# Patient Record
Sex: Male | Born: 1945 | Race: White | Hispanic: No | Marital: Single
Health system: Southern US, Community
[De-identification: ages and names within clinical notes are randomized; demographics above are authoritative.]

---

## 2005-08-24 ENCOUNTER — Other Ambulatory Visit: Payer: Self-pay

## 2005-08-24 ENCOUNTER — Inpatient Hospital Stay: Payer: Self-pay | Admitting: Internal Medicine

## 2005-08-25 ENCOUNTER — Other Ambulatory Visit: Payer: Self-pay

## 2009-08-28 ENCOUNTER — Emergency Department: Payer: Self-pay | Admitting: Emergency Medicine

## 2010-06-18 ENCOUNTER — Encounter: Payer: Self-pay | Admitting: Orthopedic Surgery

## 2010-06-22 ENCOUNTER — Encounter: Payer: Self-pay | Admitting: Orthopedic Surgery

## 2010-07-22 ENCOUNTER — Encounter: Payer: Self-pay | Admitting: Orthopedic Surgery

## 2010-08-22 ENCOUNTER — Encounter: Payer: Self-pay | Admitting: Orthopedic Surgery

## 2010-09-22 ENCOUNTER — Encounter: Payer: Self-pay | Admitting: Orthopedic Surgery

## 2010-10-22 ENCOUNTER — Encounter: Payer: Self-pay | Admitting: Orthopedic Surgery

## 2011-07-16 ENCOUNTER — Emergency Department: Payer: Self-pay | Admitting: Emergency Medicine

## 2011-07-16 LAB — BASIC METABOLIC PANEL
Anion Gap: 7 (ref 7–16)
BUN: 16 mg/dL (ref 7–18)
Chloride: 101 mmol/L (ref 98–107)
Co2: 29 mmol/L (ref 21–32)
Creatinine: 1.05 mg/dL (ref 0.60–1.30)
EGFR (Non-African Amer.): >60
Glucose: 91 mg/dL (ref 65–99)
Osmolality: 275 (ref 275–301)
Potassium: 4 mmol/L (ref 3.5–5.1)

## 2011-07-16 LAB — CBC
MCH: 32.5 pg (ref 26.0–34.0)
MCV: 100 fL (ref 80–100)
Platelet: 287 10*3/uL (ref 150–440)
RBC: 3.58 10*6/uL — ABNORMAL LOW (ref 4.40–5.90)

## 2013-05-24 ENCOUNTER — Emergency Department: Payer: Self-pay | Admitting: Emergency Medicine

## 2013-05-24 LAB — COMPREHENSIVE METABOLIC PANEL
Albumin: 3.5 g/dL (ref 3.4–5.0)
Alkaline Phosphatase: 257 U/L — ABNORMAL HIGH
Anion Gap: 4 — ABNORMAL LOW (ref 7–16)
BUN: 45 mg/dL — ABNORMAL HIGH (ref 7–18)
Bilirubin,Total: 0.5 mg/dL (ref 0.2–1.0)
CO2: 32 mmol/L (ref 21–32)
Calcium, Total: 9 mg/dL (ref 8.5–10.1)
Chloride: 100 mmol/L (ref 98–107)
Creatinine: 1.54 mg/dL — ABNORMAL HIGH (ref 0.60–1.30)
GFR CALC AF AMER: 53 — AB
GFR CALC NON AF AMER: 46 — AB
GLUCOSE: 89 mg/dL (ref 65–99)
OSMOLALITY: 283 (ref 275–301)
Potassium: 5 mmol/L (ref 3.5–5.1)
SGOT(AST): 59 U/L — ABNORMAL HIGH (ref 15–37)
SGPT (ALT): 63 U/L (ref 12–78)
Sodium: 136 mmol/L (ref 136–145)
Total Protein: 7.2 g/dL (ref 6.4–8.2)

## 2013-05-24 LAB — CK TOTAL AND CKMB (NOT AT ARMC)
CK, Total: 143 U/L
CK-MB: 2.4 ng/mL (ref 0.5–3.6)

## 2013-05-24 LAB — APTT: ACTIVATED PTT: 52.4 s — AB (ref 23.6–35.9)

## 2013-05-24 LAB — URINALYSIS, COMPLETE
BILIRUBIN, UR: NEGATIVE
Bacteria: NONE SEEN
Blood: NEGATIVE
GLUCOSE, UR: NEGATIVE mg/dL (ref 0–75)
Hyaline Cast: 1
KETONE: NEGATIVE
LEUKOCYTE ESTERASE: NEGATIVE
NITRITE: NEGATIVE
PROTEIN: NEGATIVE
Ph: 5 (ref 4.5–8.0)
RBC,UR: 1 /HPF (ref 0–5)
Specific Gravity: 1.006 (ref 1.003–1.030)
WBC UR: <1 /HPF (ref 0–5)

## 2013-05-24 LAB — CBC
HCT: 37.8 % — ABNORMAL LOW (ref 40.0–52.0)
HGB: 12.3 g/dL — ABNORMAL LOW (ref 13.0–18.0)
MCH: 33 pg (ref 26.0–34.0)
MCHC: 32.5 g/dL (ref 32.0–36.0)
MCV: 102 fL — AB (ref 80–100)
PLATELETS: 189 10*3/uL (ref 150–440)
RBC: 3.71 10*6/uL — AB (ref 4.40–5.90)
RDW: 15.4 % — ABNORMAL HIGH (ref 11.5–14.5)
WBC: 5.9 10*3/uL (ref 3.8–10.6)

## 2013-05-24 LAB — TROPONIN I: Troponin-I: 0.04 ng/mL

## 2013-05-24 LAB — PROTIME-INR
INR: 3.2
Prothrombin Time: 31.5 secs — ABNORMAL HIGH (ref 11.5–14.7)

## 2013-05-24 LAB — PRO B NATRIURETIC PEPTIDE: B-Type Natriuretic Peptide: 688 pg/mL — ABNORMAL HIGH (ref 0–125)

## 2013-10-05 ENCOUNTER — Emergency Department: Payer: Self-pay | Admitting: Emergency Medicine

## 2013-10-05 LAB — BASIC METABOLIC PANEL
Anion Gap: 8 (ref 7–16)
BUN: 27 mg/dL — ABNORMAL HIGH (ref 7–18)
CALCIUM: 8.7 mg/dL (ref 8.5–10.1)
CREATININE: 0.9 mg/dL (ref 0.60–1.30)
Chloride: 94 mmol/L — ABNORMAL LOW (ref 98–107)
Co2: 28 mmol/L (ref 21–32)
EGFR (African American): >60
Glucose: 115 mg/dL — ABNORMAL HIGH (ref 65–99)
Osmolality: 267 (ref 275–301)
POTASSIUM: 4.5 mmol/L (ref 3.5–5.1)
SODIUM: 130 mmol/L — AB (ref 136–145)

## 2013-10-05 LAB — TROPONIN I: Troponin-I: 0.02 ng/mL

## 2013-10-05 LAB — CBC WITH DIFFERENTIAL/PLATELET
BASOS ABS: 0 10*3/uL (ref 0.0–0.1)
BASOS PCT: 0.6 %
Eosinophil #: 0.1 10*3/uL (ref 0.0–0.7)
Eosinophil %: 1.7 %
HCT: 36.7 % — ABNORMAL LOW (ref 40.0–52.0)
HGB: 11.8 g/dL — ABNORMAL LOW (ref 13.0–18.0)
LYMPHS PCT: 13.9 %
Lymphocyte #: 0.8 10*3/uL — ABNORMAL LOW (ref 1.0–3.6)
MCH: 32 pg (ref 26.0–34.0)
MCHC: 32.1 g/dL (ref 32.0–36.0)
MCV: 100 fL (ref 80–100)
Monocyte #: 0.8 x10 3/mm (ref 0.2–1.0)
Monocyte %: 13.6 %
NEUTROS PCT: 70.2 %
Neutrophil #: 3.9 10*3/uL (ref 1.4–6.5)
Platelet: 226 10*3/uL (ref 150–440)
RBC: 3.68 10*6/uL — ABNORMAL LOW (ref 4.40–5.90)
RDW: 16.2 % — ABNORMAL HIGH (ref 11.5–14.5)
WBC: 5.5 10*3/uL (ref 3.8–10.6)

## 2013-10-05 LAB — PROTIME-INR
INR: 2.9
PROTHROMBIN TIME: 29.5 s — AB (ref 11.5–14.7)

## 2013-10-18 ENCOUNTER — Inpatient Hospital Stay: Payer: Self-pay | Admitting: Internal Medicine

## 2013-10-18 LAB — CBC WITH DIFFERENTIAL/PLATELET
BASOS ABS: 0 10*3/uL (ref 0.0–0.1)
Basophil %: 0.4 %
EOS ABS: 0.1 10*3/uL (ref 0.0–0.7)
Eosinophil %: 0.9 %
HCT: 36.1 % — AB (ref 40.0–52.0)
HGB: 11.4 g/dL — AB (ref 13.0–18.0)
LYMPHS ABS: 0.8 10*3/uL — AB (ref 1.0–3.6)
Lymphocyte %: 8.8 %
MCH: 31.8 pg (ref 26.0–34.0)
MCHC: 31.7 g/dL — ABNORMAL LOW (ref 32.0–36.0)
MCV: 100 fL (ref 80–100)
MONOS PCT: 12.9 %
Monocyte #: 1.1 x10 3/mm — ABNORMAL HIGH (ref 0.2–1.0)
NEUTROS PCT: 77 %
Neutrophil #: 6.8 10*3/uL — ABNORMAL HIGH (ref 1.4–6.5)
PLATELETS: 168 10*3/uL (ref 150–440)
RBC: 3.6 10*6/uL — AB (ref 4.40–5.90)
RDW: 16.5 % — ABNORMAL HIGH (ref 11.5–14.5)
WBC: 8.8 10*3/uL (ref 3.8–10.6)

## 2013-10-18 LAB — BASIC METABOLIC PANEL
ANION GAP: 4 — AB (ref 7–16)
BUN: 16 mg/dL (ref 7–18)
CHLORIDE: 98 mmol/L (ref 98–107)
CO2: 30 mmol/L (ref 21–32)
CREATININE: 0.94 mg/dL (ref 0.60–1.30)
Calcium, Total: 8.6 mg/dL (ref 8.5–10.1)
EGFR (African American): >60
EGFR (Non-African Amer.): >60
GLUCOSE: 97 mg/dL (ref 65–99)
Osmolality: 266 (ref 275–301)
POTASSIUM: 4 mmol/L (ref 3.5–5.1)
SODIUM: 132 mmol/L — AB (ref 136–145)

## 2013-10-18 LAB — DRUG SCREEN, URINE
Amphetamines, Ur Screen: NEGATIVE (ref ?–1000)
Barbiturates, Ur Screen: NEGATIVE (ref ?–200)
Benzodiazepine, Ur Scrn: NEGATIVE (ref ?–200)
CANNABINOID 50 NG, UR ~~LOC~~: POSITIVE (ref ?–50)
COCAINE METABOLITE, UR ~~LOC~~: POSITIVE (ref ?–300)
MDMA (Ecstasy)Ur Screen: NEGATIVE (ref ?–500)
Methadone, Ur Screen: NEGATIVE (ref ?–300)
OPIATE, UR SCREEN: NEGATIVE (ref ?–300)
Phencyclidine (PCP) Ur S: NEGATIVE (ref ?–25)
TRICYCLIC, UR SCREEN: NEGATIVE (ref ?–1000)

## 2013-10-18 LAB — PROTIME-INR
INR: 1.4
PROTHROMBIN TIME: 17.1 s — AB (ref 11.5–14.7)

## 2013-10-18 LAB — ETHANOL: Ethanol: <3 mg/dL

## 2013-10-19 LAB — CBC WITH DIFFERENTIAL/PLATELET
BASOS PCT: 0.6 %
Basophil #: 0 10*3/uL (ref 0.0–0.1)
Eosinophil #: 0.2 10*3/uL (ref 0.0–0.7)
Eosinophil %: 2.3 %
HCT: 36.4 % — ABNORMAL LOW (ref 40.0–52.0)
HGB: 12 g/dL — ABNORMAL LOW (ref 13.0–18.0)
Lymphocyte #: 0.6 10*3/uL — ABNORMAL LOW (ref 1.0–3.6)
Lymphocyte %: 7.3 %
MCH: 33.3 pg (ref 26.0–34.0)
MCHC: 33.1 g/dL (ref 32.0–36.0)
MCV: 101 fL — AB (ref 80–100)
MONOS PCT: 10.8 %
Monocyte #: 0.9 x10 3/mm (ref 0.2–1.0)
Neutrophil #: 6.3 10*3/uL (ref 1.4–6.5)
Neutrophil %: 79 %
Platelet: 173 10*3/uL (ref 150–440)
RBC: 3.61 10*6/uL — ABNORMAL LOW (ref 4.40–5.90)
RDW: 16.5 % — AB (ref 11.5–14.5)
WBC: 7.9 10*3/uL (ref 3.8–10.6)

## 2013-10-19 LAB — BASIC METABOLIC PANEL
Anion Gap: 6 — ABNORMAL LOW (ref 7–16)
BUN: 11 mg/dL (ref 7–18)
CALCIUM: 8.3 mg/dL — AB (ref 8.5–10.1)
Chloride: 98 mmol/L (ref 98–107)
Co2: 27 mmol/L (ref 21–32)
Creatinine: 0.87 mg/dL (ref 0.60–1.30)
Glucose: 91 mg/dL (ref 65–99)
OSMOLALITY: 262 (ref 275–301)
Potassium: 4 mmol/L (ref 3.5–5.1)
Sodium: 131 mmol/L — ABNORMAL LOW (ref 136–145)

## 2013-10-19 LAB — HEMOGLOBIN A1C: HEMOGLOBIN A1C: 5.9 % (ref 4.2–6.3)

## 2013-10-20 LAB — CBC WITH DIFFERENTIAL/PLATELET
BASOS ABS: 0 10*3/uL (ref 0.0–0.1)
BASOS PCT: 0.5 %
Eosinophil #: 0.2 10*3/uL (ref 0.0–0.7)
Eosinophil %: 2.9 %
HCT: 36.2 % — AB (ref 40.0–52.0)
HGB: 12 g/dL — ABNORMAL LOW (ref 13.0–18.0)
LYMPHS ABS: 0.8 10*3/uL — AB (ref 1.0–3.6)
Lymphocyte %: 10.5 %
MCH: 33.4 pg (ref 26.0–34.0)
MCHC: 33.3 g/dL (ref 32.0–36.0)
MCV: 101 fL — AB (ref 80–100)
Monocyte #: 0.9 x10 3/mm (ref 0.2–1.0)
Monocyte %: 11.5 %
NEUTROS PCT: 74.6 %
Neutrophil #: 5.6 10*3/uL (ref 1.4–6.5)
Platelet: 162 10*3/uL (ref 150–440)
RBC: 3.6 10*6/uL — ABNORMAL LOW (ref 4.40–5.90)
RDW: 16.9 % — ABNORMAL HIGH (ref 11.5–14.5)
WBC: 7.4 10*3/uL (ref 3.8–10.6)

## 2013-10-20 LAB — BASIC METABOLIC PANEL
Anion Gap: 3 — ABNORMAL LOW (ref 7–16)
BUN: 9 mg/dL (ref 7–18)
CHLORIDE: 99 mmol/L (ref 98–107)
CO2: 29 mmol/L (ref 21–32)
Calcium, Total: 8.5 mg/dL (ref 8.5–10.1)
Creatinine: 0.72 mg/dL (ref 0.60–1.30)
EGFR (Non-African Amer.): >60
Glucose: 83 mg/dL (ref 65–99)
Osmolality: 260 (ref 275–301)
Potassium: 4.2 mmol/L (ref 3.5–5.1)
Sodium: 131 mmol/L — ABNORMAL LOW (ref 136–145)

## 2013-10-20 LAB — VANCOMYCIN, TROUGH: VANCOMYCIN, TROUGH: 15 ug/mL (ref 10–20)

## 2013-10-21 LAB — CBC WITH DIFFERENTIAL/PLATELET
Basophil #: 0 10*3/uL (ref 0.0–0.1)
Basophil %: 0.8 %
Eosinophil #: 0.2 10*3/uL (ref 0.0–0.7)
Eosinophil %: 3.6 %
HCT: 35.5 % — ABNORMAL LOW (ref 40.0–52.0)
HGB: 11.8 g/dL — AB (ref 13.0–18.0)
Lymphocyte #: 0.7 10*3/uL — ABNORMAL LOW (ref 1.0–3.6)
Lymphocyte %: 11.3 %
MCH: 33.4 pg (ref 26.0–34.0)
MCHC: 33.3 g/dL (ref 32.0–36.0)
MCV: 100 fL (ref 80–100)
MONO ABS: 0.7 x10 3/mm (ref 0.2–1.0)
Monocyte %: 12.2 %
NEUTROS ABS: 4.3 10*3/uL (ref 1.4–6.5)
Neutrophil %: 72.1 %
PLATELETS: 187 10*3/uL (ref 150–440)
RBC: 3.53 10*6/uL — ABNORMAL LOW (ref 4.40–5.90)
RDW: 16.9 % — ABNORMAL HIGH (ref 11.5–14.5)
WBC: 6 10*3/uL (ref 3.8–10.6)

## 2013-10-21 LAB — BASIC METABOLIC PANEL
Anion Gap: 5 — ABNORMAL LOW (ref 7–16)
BUN: 8 mg/dL (ref 7–18)
CREATININE: 0.69 mg/dL (ref 0.60–1.30)
Calcium, Total: 8.5 mg/dL (ref 8.5–10.1)
Chloride: 100 mmol/L (ref 98–107)
Co2: 27 mmol/L (ref 21–32)
EGFR (African American): >60
Glucose: 97 mg/dL (ref 65–99)
Osmolality: 263 (ref 275–301)
Potassium: 3.9 mmol/L (ref 3.5–5.1)
Sodium: 132 mmol/L — ABNORMAL LOW (ref 136–145)

## 2013-10-21 LAB — DRUG SCREEN, URINE
AMPHETAMINES, UR SCREEN: NEGATIVE (ref ?–1000)
Barbiturates, Ur Screen: NEGATIVE (ref ?–200)
Benzodiazepine, Ur Scrn: NEGATIVE (ref ?–200)
Cannabinoid 50 Ng, Ur ~~LOC~~: POSITIVE (ref ?–50)
Cocaine Metabolite,Ur ~~LOC~~: POSITIVE (ref ?–300)
MDMA (ECSTASY) UR SCREEN: NEGATIVE (ref ?–500)
Methadone, Ur Screen: NEGATIVE (ref ?–300)
Opiate, Ur Screen: NEGATIVE (ref ?–300)
PHENCYCLIDINE (PCP) UR S: NEGATIVE (ref ?–25)
Tricyclic, Ur Screen: NEGATIVE (ref ?–1000)

## 2013-10-21 LAB — VANCOMYCIN, TROUGH: Vancomycin, Trough: 14 ug/mL (ref 10–20)

## 2013-10-22 LAB — COMPREHENSIVE METABOLIC PANEL
ALK PHOS: 365 U/L — AB
ANION GAP: 7 (ref 7–16)
Albumin: 2.9 g/dL — ABNORMAL LOW (ref 3.4–5.0)
BILIRUBIN TOTAL: 2 mg/dL — AB (ref 0.2–1.0)
BUN: 11 mg/dL (ref 7–18)
CO2: 25 mmol/L (ref 21–32)
Calcium, Total: 9.1 mg/dL (ref 8.5–10.1)
Chloride: 99 mmol/L (ref 98–107)
Creatinine: 1.03 mg/dL (ref 0.60–1.30)
EGFR (African American): >60
EGFR (Non-African Amer.): >60
GLUCOSE: 113 mg/dL — AB (ref 65–99)
Osmolality: 263 (ref 275–301)
Potassium: 4.3 mmol/L (ref 3.5–5.1)
SGOT(AST): 49 U/L — ABNORMAL HIGH (ref 15–37)
SGPT (ALT): 58 U/L
Sodium: 131 mmol/L — ABNORMAL LOW (ref 136–145)
TOTAL PROTEIN: 7.2 g/dL (ref 6.4–8.2)

## 2013-10-22 LAB — CBC WITH DIFFERENTIAL/PLATELET
Basophil #: 0.1 10*3/uL (ref 0.0–0.1)
Basophil %: 1 %
EOS ABS: 0.1 10*3/uL (ref 0.0–0.7)
EOS PCT: 1.9 %
HCT: 40 % (ref 40.0–52.0)
HGB: 12.7 g/dL — ABNORMAL LOW (ref 13.0–18.0)
LYMPHS ABS: 1.2 10*3/uL (ref 1.0–3.6)
Lymphocyte %: 17.6 %
MCH: 32.4 pg (ref 26.0–34.0)
MCHC: 31.7 g/dL — ABNORMAL LOW (ref 32.0–36.0)
MCV: 102 fL — ABNORMAL HIGH (ref 80–100)
MONO ABS: 1.2 x10 3/mm — AB (ref 0.2–1.0)
MONOS PCT: 17.1 %
NEUTROS ABS: 4.2 10*3/uL (ref 1.4–6.5)
NEUTROS PCT: 62.4 %
Platelet: 219 10*3/uL (ref 150–440)
RBC: 3.91 10*6/uL — AB (ref 4.40–5.90)
RDW: 17.3 % — ABNORMAL HIGH (ref 11.5–14.5)
WBC: 6.8 10*3/uL (ref 3.8–10.6)

## 2013-10-22 LAB — AMMONIA: AMMONIA, PLASMA: 57 umol/L — AB (ref 11–32)

## 2013-10-22 LAB — WOUND CULTURE

## 2013-10-23 DIAGNOSIS — I341 Nonrheumatic mitral (valve) prolapse: Secondary | ICD-10-CM

## 2013-10-23 LAB — BASIC METABOLIC PANEL
ANION GAP: 8 (ref 7–16)
BUN: 14 mg/dL (ref 7–18)
Calcium, Total: 9 mg/dL (ref 8.5–10.1)
Chloride: 99 mmol/L (ref 98–107)
Co2: 24 mmol/L (ref 21–32)
Creatinine: 1.3 mg/dL (ref 0.60–1.30)
EGFR (Non-African Amer.): 59 — ABNORMAL LOW
Glucose: 97 mg/dL (ref 65–99)
Osmolality: 263 (ref 275–301)
POTASSIUM: 4.1 mmol/L (ref 3.5–5.1)
Sodium: 131 mmol/L — ABNORMAL LOW (ref 136–145)

## 2013-10-23 LAB — CBC WITH DIFFERENTIAL/PLATELET
BASOS PCT: 0.7 %
Basophil #: 0 10*3/uL (ref 0.0–0.1)
EOS ABS: 0.1 10*3/uL (ref 0.0–0.7)
EOS PCT: 2.2 %
HCT: 39.4 % — ABNORMAL LOW (ref 40.0–52.0)
HGB: 12.9 g/dL — AB (ref 13.0–18.0)
LYMPHS ABS: 0.8 10*3/uL — AB (ref 1.0–3.6)
Lymphocyte %: 11.7 %
MCH: 33.3 pg (ref 26.0–34.0)
MCHC: 32.9 g/dL (ref 32.0–36.0)
MCV: 101 fL — AB (ref 80–100)
MONOS PCT: 16.6 %
Monocyte #: 1.1 x10 3/mm — ABNORMAL HIGH (ref 0.2–1.0)
Neutrophil #: 4.6 10*3/uL (ref 1.4–6.5)
Neutrophil %: 68.8 %
Platelet: 244 10*3/uL (ref 150–440)
RBC: 3.89 10*6/uL — ABNORMAL LOW (ref 4.40–5.90)
RDW: 17.2 % — AB (ref 11.5–14.5)
WBC: 6.7 10*3/uL (ref 3.8–10.6)

## 2013-10-23 LAB — DIGOXIN LEVEL: Digoxin: 0.31 ng/mL

## 2013-10-23 LAB — VANCOMYCIN, TROUGH: VANCOMYCIN, TROUGH: 35 ug/mL — AB (ref 10–20)

## 2013-10-24 LAB — HEPATIC FUNCTION PANEL A (ARMC)
ALBUMIN: 2.7 g/dL — AB (ref 3.4–5.0)
AST: 53 U/L — AB (ref 15–37)
Alkaline Phosphatase: 297 U/L — ABNORMAL HIGH
BILIRUBIN TOTAL: 1.6 mg/dL — AB (ref 0.2–1.0)
Bilirubin, Direct: 0.9 mg/dL — ABNORMAL HIGH (ref 0.00–0.20)
SGPT (ALT): 61 U/L
Total Protein: 6.7 g/dL (ref 6.4–8.2)

## 2013-10-24 LAB — BASIC METABOLIC PANEL
Anion Gap: 4 — ABNORMAL LOW (ref 7–16)
BUN: 15 mg/dL (ref 7–18)
CREATININE: 1.41 mg/dL — AB (ref 0.60–1.30)
Calcium, Total: 8.8 mg/dL (ref 8.5–10.1)
Chloride: 98 mmol/L (ref 98–107)
Co2: 28 mmol/L (ref 21–32)
EGFR (African American): >60
EGFR (Non-African Amer.): 53 — ABNORMAL LOW
Glucose: 135 mg/dL — ABNORMAL HIGH (ref 65–99)
Osmolality: 264 (ref 275–301)
Potassium: 3.9 mmol/L (ref 3.5–5.1)
SODIUM: 130 mmol/L — AB (ref 136–145)

## 2013-10-24 LAB — CREATININE, SERUM
Creatinine: 1.44 mg/dL — ABNORMAL HIGH (ref 0.60–1.30)
EGFR (Non-African Amer.): 52 — ABNORMAL LOW

## 2013-10-24 LAB — TSH: Thyroid Stimulating Horm: 2.07 u[IU]/mL

## 2013-10-24 LAB — FOLATE: Folic Acid: 17.7 ng/mL (ref 3.1–100.0)

## 2013-10-24 LAB — HEMOGLOBIN: HGB: 12.7 g/dL — ABNORMAL LOW (ref 13.0–18.0)

## 2013-10-24 LAB — VANCOMYCIN, RANDOM: Vancomycin, Random: 20 ug/mL

## 2013-10-24 LAB — SODIUM: Sodium: 131 mmol/L — ABNORMAL LOW (ref 136–145)

## 2013-10-24 LAB — POTASSIUM: Potassium: 4.8 mmol/L (ref 3.5–5.1)

## 2013-10-24 LAB — DIGOXIN LEVEL: DIGOXIN: 1.1 ng/mL

## 2013-10-25 LAB — AMMONIA: AMMONIA, PLASMA: 56 umol/L — AB (ref 11–32)

## 2013-10-25 LAB — PROTIME-INR
INR: 1.2
PROTHROMBIN TIME: 14.9 s — AB (ref 11.5–14.7)

## 2013-10-25 LAB — SODIUM: SODIUM: 134 mmol/L — AB (ref 136–145)

## 2013-10-26 LAB — BASIC METABOLIC PANEL
Anion Gap: 5 — ABNORMAL LOW (ref 7–16)
BUN: 12 mg/dL (ref 7–18)
CHLORIDE: 102 mmol/L (ref 98–107)
CO2: 27 mmol/L (ref 21–32)
CREATININE: 1.29 mg/dL (ref 0.60–1.30)
Calcium, Total: 9 mg/dL (ref 8.5–10.1)
EGFR (African American): >60
GFR CALC NON AF AMER: 59 — AB
Glucose: 110 mg/dL — ABNORMAL HIGH (ref 65–99)
OSMOLALITY: 269 (ref 275–301)
POTASSIUM: 4 mmol/L (ref 3.5–5.1)
Sodium: 134 mmol/L — ABNORMAL LOW (ref 136–145)

## 2013-10-26 LAB — PROTIME-INR
INR: 1.2
Prothrombin Time: 14.7 secs (ref 11.5–14.7)

## 2013-10-26 LAB — PATHOLOGY REPORT

## 2013-10-27 LAB — BASIC METABOLIC PANEL
ANION GAP: 4 — AB (ref 7–16)
BUN: 9 mg/dL (ref 7–18)
CREATININE: 1.23 mg/dL (ref 0.60–1.30)
Calcium, Total: 8.8 mg/dL (ref 8.5–10.1)
Chloride: 102 mmol/L (ref 98–107)
Co2: 27 mmol/L (ref 21–32)
EGFR (African American): >60
Glucose: 141 mg/dL — ABNORMAL HIGH (ref 65–99)
Osmolality: 267 (ref 275–301)
POTASSIUM: 3.9 mmol/L (ref 3.5–5.1)
SODIUM: 133 mmol/L — AB (ref 136–145)

## 2013-10-27 LAB — CBC WITH DIFFERENTIAL/PLATELET
BASOS PCT: 0.4 %
Basophil #: 0 10*3/uL (ref 0.0–0.1)
Eosinophil #: 0.2 10*3/uL (ref 0.0–0.7)
Eosinophil %: 3.7 %
HCT: 35.5 % — AB (ref 40.0–52.0)
HGB: 11.2 g/dL — ABNORMAL LOW (ref 13.0–18.0)
LYMPHS ABS: 0.6 10*3/uL — AB (ref 1.0–3.6)
LYMPHS PCT: 10.3 %
MCH: 32.1 pg (ref 26.0–34.0)
MCHC: 31.7 g/dL — AB (ref 32.0–36.0)
MCV: 101 fL — AB (ref 80–100)
MONOS PCT: 14.4 %
Monocyte #: 0.8 x10 3/mm (ref 0.2–1.0)
NEUTROS PCT: 71.2 %
Neutrophil #: 4.1 10*3/uL (ref 1.4–6.5)
PLATELETS: 208 10*3/uL (ref 150–440)
RBC: 3.51 10*6/uL — ABNORMAL LOW (ref 4.40–5.90)
RDW: 17.5 % — AB (ref 11.5–14.5)
WBC: 5.7 10*3/uL (ref 3.8–10.6)

## 2013-10-27 LAB — WOUND CULTURE

## 2013-10-27 LAB — PROTIME-INR
INR: 1.2
PROTHROMBIN TIME: 15.1 s — AB (ref 11.5–14.7)

## 2013-10-27 LAB — PLATELET COUNT: Platelet: 206 10*3/uL (ref 150–440)

## 2013-12-18 ENCOUNTER — Inpatient Hospital Stay: Payer: Self-pay | Admitting: Internal Medicine

## 2013-12-18 ENCOUNTER — Emergency Department: Payer: Self-pay | Admitting: Emergency Medicine

## 2013-12-18 LAB — COMPREHENSIVE METABOLIC PANEL
ALT: 46 U/L
ANION GAP: 6 — AB (ref 7–16)
AST: 59 U/L — AB (ref 15–37)
Albumin: 2.4 g/dL — ABNORMAL LOW (ref 3.4–5.0)
Alkaline Phosphatase: 243 U/L — ABNORMAL HIGH
BILIRUBIN TOTAL: 1.4 mg/dL — AB (ref 0.2–1.0)
BUN: 14 mg/dL (ref 7–18)
CO2: 34 mmol/L — AB (ref 21–32)
Calcium, Total: 8.4 mg/dL — ABNORMAL LOW (ref 8.5–10.1)
Chloride: 97 mmol/L — ABNORMAL LOW (ref 98–107)
Creatinine: 0.96 mg/dL (ref 0.60–1.30)
EGFR (Non-African Amer.): >60
GLUCOSE: 86 mg/dL (ref 65–99)
OSMOLALITY: 274 (ref 275–301)
Potassium: 3.5 mmol/L (ref 3.5–5.1)
SODIUM: 137 mmol/L (ref 136–145)
TOTAL PROTEIN: 5.9 g/dL — AB (ref 6.4–8.2)

## 2013-12-18 LAB — URINALYSIS, COMPLETE
BLOOD: NEGATIVE
Bacteria: NONE SEEN
Bilirubin,UR: NEGATIVE
Glucose,UR: NEGATIVE mg/dL (ref 0–75)
Hyaline Cast: 3
KETONE: NEGATIVE
Leukocyte Esterase: NEGATIVE
Nitrite: NEGATIVE
Ph: 6 (ref 4.5–8.0)
Protein: NEGATIVE
RBC,UR: 1 /HPF (ref 0–5)
SPECIFIC GRAVITY: 1.009 (ref 1.003–1.030)
Squamous Epithelial: NONE SEEN
WBC UR: <1 /HPF (ref 0–5)

## 2013-12-18 LAB — CBC
HCT: 32.3 % — ABNORMAL LOW (ref 40.0–52.0)
HGB: 10.4 g/dL — ABNORMAL LOW (ref 13.0–18.0)
MCH: 31 pg (ref 26.0–34.0)
MCHC: 32.2 g/dL (ref 32.0–36.0)
MCV: 96 fL (ref 80–100)
Platelet: 162 10*3/uL (ref 150–440)
RBC: 3.36 10*6/uL — ABNORMAL LOW (ref 4.40–5.90)
RDW: 17.1 % — ABNORMAL HIGH (ref 11.5–14.5)
WBC: 5.1 10*3/uL (ref 3.8–10.6)

## 2013-12-18 LAB — DRUG SCREEN, URINE

## 2013-12-18 LAB — PROTIME-INR
INR: 2
INR: 2
Prothrombin Time: 22.3 secs — ABNORMAL HIGH (ref 11.5–14.7)
Prothrombin Time: 22.3 secs — ABNORMAL HIGH (ref 11.5–14.7)

## 2013-12-18 LAB — CK TOTAL AND CKMB (NOT AT ARMC)
CK, Total: 55 U/L (ref 39–308)
CK-MB: 2.2 ng/mL (ref 0.5–3.6)

## 2013-12-18 LAB — TROPONIN I
Troponin-I: 0.02 ng/mL
Troponin-I: 0.02 ng/mL

## 2013-12-18 LAB — PRO B NATRIURETIC PEPTIDE: B-TYPE NATIURETIC PEPTID: 785 pg/mL — AB (ref 0–125)

## 2013-12-18 LAB — ACETAMINOPHEN LEVEL: ACETAMINOPHEN: 2 ug/mL — AB

## 2013-12-18 LAB — CK-MB: CK-MB: 1.7 ng/mL (ref 0.5–3.6)

## 2013-12-18 LAB — APTT: ACTIVATED PTT: 45.4 s — AB (ref 23.6–35.9)

## 2013-12-18 LAB — ETHANOL: Ethanol: <3 mg/dL

## 2013-12-18 LAB — AMMONIA: Ammonia, Plasma: 16 mcmol/L (ref 11–32)

## 2013-12-18 LAB — SALICYLATE LEVEL: Salicylates, Serum: <1.7 mg/dL

## 2013-12-19 LAB — URINALYSIS, COMPLETE
BILIRUBIN, UR: NEGATIVE
BLOOD: NEGATIVE
Bacteria: NONE SEEN
GLUCOSE, UR: NEGATIVE mg/dL (ref 0–75)
Ketone: NEGATIVE
Leukocyte Esterase: NEGATIVE
Nitrite: NEGATIVE
Ph: 5 (ref 4.5–8.0)
Protein: NEGATIVE
RBC,UR: 1 /HPF (ref 0–5)
SPECIFIC GRAVITY: 1.015 (ref 1.003–1.030)
Squamous Epithelial: <1
WBC UR: 2 /HPF (ref 0–5)

## 2013-12-19 LAB — BASIC METABOLIC PANEL
Anion Gap: 6 — ABNORMAL LOW (ref 7–16)
BUN: 13 mg/dL (ref 7–18)
CALCIUM: 8.9 mg/dL (ref 8.5–10.1)
CO2: 35 mmol/L — AB (ref 21–32)
Chloride: 97 mmol/L — ABNORMAL LOW (ref 98–107)
Creatinine: 1.01 mg/dL (ref 0.60–1.30)
EGFR (African American): >60
EGFR (Non-African Amer.): >60
Glucose: 73 mg/dL (ref 65–99)
OSMOLALITY: 274 (ref 275–301)
POTASSIUM: 3.7 mmol/L (ref 3.5–5.1)
Sodium: 138 mmol/L (ref 136–145)

## 2013-12-19 LAB — CBC WITH DIFFERENTIAL/PLATELET
Basophil #: 0 10*3/uL (ref 0.0–0.1)
Basophil %: 0.7 %
Eosinophil #: 0.1 10*3/uL (ref 0.0–0.7)
Eosinophil %: 1.6 %
HCT: 33.2 % — ABNORMAL LOW (ref 40.0–52.0)
HGB: 10.8 g/dL — ABNORMAL LOW (ref 13.0–18.0)
Lymphocyte #: 0.8 10*3/uL — ABNORMAL LOW (ref 1.0–3.6)
Lymphocyte %: 14.1 %
MCH: 31.3 pg (ref 26.0–34.0)
MCHC: 32.6 g/dL (ref 32.0–36.0)
MCV: 96 fL (ref 80–100)
Monocyte #: 0.8 x10 3/mm (ref 0.2–1.0)
Monocyte %: 13.9 %
NEUTROS ABS: 4.2 10*3/uL (ref 1.4–6.5)
Neutrophil %: 69.7 %
Platelet: 161 10*3/uL (ref 150–440)
RBC: 3.46 10*6/uL — ABNORMAL LOW (ref 4.40–5.90)
RDW: 17.2 % — ABNORMAL HIGH (ref 11.5–14.5)
WBC: 6 10*3/uL (ref 3.8–10.6)

## 2013-12-19 LAB — TROPONIN I: TROPONIN-I: 0.03 ng/mL

## 2013-12-19 LAB — CK-MB: CK-MB: 1.6 ng/mL (ref 0.5–3.6)

## 2013-12-20 LAB — BASIC METABOLIC PANEL
ANION GAP: 6 — AB (ref 7–16)
BUN: 14 mg/dL (ref 7–18)
CHLORIDE: 98 mmol/L (ref 98–107)
Calcium, Total: 8.6 mg/dL (ref 8.5–10.1)
Co2: 35 mmol/L — ABNORMAL HIGH (ref 21–32)
Creatinine: 1.05 mg/dL (ref 0.60–1.30)
EGFR (African American): >60
EGFR (Non-African Amer.): >60
GLUCOSE: 92 mg/dL (ref 65–99)
Osmolality: 278 (ref 275–301)
Potassium: 3.5 mmol/L (ref 3.5–5.1)
Sodium: 139 mmol/L (ref 136–145)

## 2013-12-20 LAB — CBC WITH DIFFERENTIAL/PLATELET
BASOS ABS: 0 10*3/uL (ref 0.0–0.1)
Basophil %: 0.5 %
Eosinophil #: 0.1 10*3/uL (ref 0.0–0.7)
Eosinophil %: 2 %
HCT: 30.3 % — ABNORMAL LOW (ref 40.0–52.0)
HGB: 9.8 g/dL — ABNORMAL LOW (ref 13.0–18.0)
LYMPHS ABS: 1.1 10*3/uL (ref 1.0–3.6)
Lymphocyte %: 17.5 %
MCH: 30.6 pg (ref 26.0–34.0)
MCHC: 32.3 g/dL (ref 32.0–36.0)
MCV: 95 fL (ref 80–100)
MONOS PCT: 17.4 %
Monocyte #: 1.1 x10 3/mm — ABNORMAL HIGH (ref 0.2–1.0)
NEUTROS ABS: 3.8 10*3/uL (ref 1.4–6.5)
NEUTROS PCT: 62.6 %
Platelet: 166 10*3/uL (ref 150–440)
RBC: 3.19 10*6/uL — ABNORMAL LOW (ref 4.40–5.90)
RDW: 17.1 % — ABNORMAL HIGH (ref 11.5–14.5)
WBC: 6 10*3/uL (ref 3.8–10.6)

## 2013-12-20 LAB — PROTIME-INR
INR: 2.1
Prothrombin Time: 22.7 secs — ABNORMAL HIGH (ref 11.5–14.7)

## 2013-12-23 LAB — CULTURE, BLOOD (SINGLE)

## 2014-01-04 ENCOUNTER — Emergency Department: Payer: Self-pay | Admitting: Internal Medicine

## 2014-01-04 LAB — COMPREHENSIVE METABOLIC PANEL
ALBUMIN: 2.5 g/dL — AB (ref 3.4–5.0)
AST: 59 U/L — AB (ref 15–37)
Alkaline Phosphatase: 322 U/L — ABNORMAL HIGH
Anion Gap: 4 — ABNORMAL LOW (ref 7–16)
BILIRUBIN TOTAL: 1.3 mg/dL — AB (ref 0.2–1.0)
BUN: 21 mg/dL — ABNORMAL HIGH (ref 7–18)
CALCIUM: 9.2 mg/dL (ref 8.5–10.1)
CHLORIDE: 94 mmol/L — AB (ref 98–107)
CO2: 37 mmol/L — AB (ref 21–32)
CREATININE: 1.2 mg/dL (ref 0.60–1.30)
GLUCOSE: 103 mg/dL — AB (ref 65–99)
Osmolality: 273 (ref 275–301)
POTASSIUM: 4.3 mmol/L (ref 3.5–5.1)
SGPT (ALT): 54 U/L
Sodium: 135 mmol/L — ABNORMAL LOW (ref 136–145)
TOTAL PROTEIN: 6.8 g/dL (ref 6.4–8.2)

## 2014-01-04 LAB — CBC
HCT: 32.4 % — ABNORMAL LOW (ref 40.0–52.0)
HGB: 10.5 g/dL — ABNORMAL LOW (ref 13.0–18.0)
MCH: 30.3 pg (ref 26.0–34.0)
MCHC: 32.3 g/dL (ref 32.0–36.0)
MCV: 94 fL (ref 80–100)
PLATELETS: 224 10*3/uL (ref 150–440)
RBC: 3.45 10*6/uL — ABNORMAL LOW (ref 4.40–5.90)
RDW: 17.1 % — AB (ref 11.5–14.5)
WBC: 5.5 10*3/uL (ref 3.8–10.6)

## 2014-01-04 LAB — URINALYSIS, COMPLETE
Bacteria: NONE SEEN
Bilirubin,UR: NEGATIVE
Blood: NEGATIVE
Glucose,UR: NEGATIVE mg/dL (ref 0–75)
KETONE: NEGATIVE
LEUKOCYTE ESTERASE: NEGATIVE
Nitrite: NEGATIVE
Ph: 7 (ref 4.5–8.0)
Protein: NEGATIVE
RBC,UR: <1 /HPF (ref 0–5)
SPECIFIC GRAVITY: 1.006 (ref 1.003–1.030)
WBC UR: <1 /HPF (ref 0–5)

## 2014-01-04 LAB — TROPONIN I: TROPONIN-I: 0.02 ng/mL

## 2014-01-04 LAB — PRO B NATRIURETIC PEPTIDE: B-Type Natriuretic Peptide: 998 pg/mL — ABNORMAL HIGH (ref 0–125)

## 2014-01-04 LAB — PROTIME-INR
INR: 1.6
Prothrombin Time: 18.5 secs — ABNORMAL HIGH (ref 11.5–14.7)

## 2014-01-26 ENCOUNTER — Emergency Department: Payer: Self-pay | Admitting: Emergency Medicine

## 2014-01-26 LAB — BASIC METABOLIC PANEL
Anion Gap: 5 — ABNORMAL LOW (ref 7–16)
BUN: 17 mg/dL (ref 7–18)
Calcium, Total: 8.6 mg/dL (ref 8.5–10.1)
Chloride: 95 mmol/L — ABNORMAL LOW (ref 98–107)
Co2: 34 mmol/L — ABNORMAL HIGH (ref 21–32)
Creatinine: 1.14 mg/dL (ref 0.60–1.30)
EGFR (African American): >60
GLUCOSE: 101 mg/dL — AB (ref 65–99)
OSMOLALITY: 270 (ref 275–301)
POTASSIUM: 3.9 mmol/L (ref 3.5–5.1)
Sodium: 134 mmol/L — ABNORMAL LOW (ref 136–145)

## 2014-01-26 LAB — APTT: Activated PTT: 44.5 secs — ABNORMAL HIGH (ref 23.6–35.9)

## 2014-01-26 LAB — CBC WITH DIFFERENTIAL/PLATELET
Basophil #: 0 10*3/uL (ref 0.0–0.1)
Basophil %: 0.9 %
EOS PCT: 2.7 %
Eosinophil #: 0.1 10*3/uL (ref 0.0–0.7)
HCT: 33.8 % — ABNORMAL LOW (ref 40.0–52.0)
HGB: 10.8 g/dL — AB (ref 13.0–18.0)
LYMPHS ABS: 0.8 10*3/uL — AB (ref 1.0–3.6)
Lymphocyte %: 15.1 %
MCH: 29.4 pg (ref 26.0–34.0)
MCHC: 32 g/dL (ref 32.0–36.0)
MCV: 92 fL (ref 80–100)
MONOS PCT: 19.4 %
Monocyte #: 1 x10 3/mm (ref 0.2–1.0)
Neutrophil #: 3.3 10*3/uL (ref 1.4–6.5)
Neutrophil %: 61.9 %
PLATELETS: 218 10*3/uL (ref 150–440)
RBC: 3.67 10*6/uL — AB (ref 4.40–5.90)
RDW: 18 % — AB (ref 11.5–14.5)
WBC: 5.3 10*3/uL (ref 3.8–10.6)

## 2014-01-26 LAB — DIGOXIN LEVEL: Digoxin: 0.71 ng/mL

## 2014-01-26 LAB — TROPONIN I: TROPONIN-I: 0.02 ng/mL

## 2014-01-26 LAB — PROTIME-INR
INR: 1.6
PROTHROMBIN TIME: 18.6 s — AB (ref 11.5–14.7)

## 2014-03-28 ENCOUNTER — Other Ambulatory Visit: Payer: Self-pay | Admitting: Family Medicine

## 2014-05-14 NOTE — Consult Note (Signed)
PATIENT NAME:  John SanteeHOLMES, Treyvone D MR#:  [id number] DATE OF BIRTH:  [birth year]  DATE OF CONSULTATION:  10/18/2013  REFERRING PHYSICIAN:   CONSULTING PHYSICIAN:  Rhona RaiderMatthew G. Esaiah Wanless, DPM  ADDENDUM:    CURRENT MEDICATIONS: Include atorvastatin 10 mg 3 times a week, Coreg 6.25 mg p.o. b.i.d., digoxin 125 mcg 2 tablets daily, enalapril 10 mg twice a day, furosemide 40 mg b.i.d., Neurontin 300 mg t.i.d., Ativan 1 mg b.i.d., metformin 500 mg b.i.d., Spiriva 18 mcg daily, Zoloft 100 mg p.o. daily, Bactrim 1 b.i.d., Symbicort 160/4.5 mcg inhalation 2 times daily, tamsulosin 0.4 mg daily, Coumadin 10 mg 1-1/2 tablets Monday, Wednesday, and Friday, 10 mg on the other days.    ____________________________ Rhona RaiderMatthew G. Aradhya Shellenbarger, DPM mgt:bu D: 10/18/2013 17:37:32 ET T: 10/18/2013 19:38:21 ET JOB#: [id number]  cc: Rhona RaiderMatthew G. Okley Magnussen, DPM, <Dictator> Epimenio SarinMATTHEW G Gennie Dib MD ELECTRONICALLY SIGNED 11/09/2013 10:08

## 2014-05-14 NOTE — Consult Note (Signed)
Brief Consult Note: Diagnosis: alcohol abuse.   Patient was seen by consultant.   Consult note dictated.   Recommend further assessment or treatment.   Orders entered.   Comments: Psychiatry: Patient seen. Chart reviewed. Note dictated. PAtient with alcohol abuse. Will make sure CIWA orders in place if needed. Will need outpt referral after medically well. Will follow.  Electronic Signatures: Tasman Zapata, Jackquline DenmarkJohn T (MD)  (Signed 29-Sep-15 14:14)  Authored: Brief Consult Note   Last Updated: 29-Sep-15 14:14 by Audery Amellapacs, Aura Bibby T (MD)

## 2014-05-14 NOTE — Discharge Summary (Signed)
PATIENT NAME:  John Conrad, John Conrad MR#:  [id number] DATE OF BIRTH:  06-20-1945  DATE OF ADMISSION:  10/18/2013 DATE OF DISCHARGE:    ADMITTING DIAGNOSIS: Diabetic foot infection  with possible osteomyelitis.   DISCHARGE DIAGNOSES: 1.  Acute left fifth metatarsal head osteomyelitis, status post left fifth metatarsal head resection on 10/21/2013 by Dr. Orland Jarred.  2.  Metabolic encephalopathy with acute hypercarbic respiratory failure, requiring intubation.  3.  Hepatic encephalopathy.  4.  Suspected liver cirrhosis, alcoholic liver disease.  5.  Alcohol withdrawal during this admission.  6.  Acute renal failure, likely dehydration, resolved on IV fluids.  7.  Atrial fibrillation, rapid ventricular response, on Coumadin therapy chronically, INR 1.2 on 10/27/2013.  8.  Morbid obesity.  9.  Chronic obstructive pulmonary disease, stable.  10.  Diabetes mellitus with hemoglobin A1c 5.9.  11.  Constipation, resolved with enema on 10/26/2013.   PROCEDURES: PICC line placement on 10/23/2013. The patient's PICC line care should be per protocol.   MEDICATIONS: Sertraline 100 mg p.o. daily, digoxin 125 mcg 2 tablets once daily, gabapentin 300 mg 3 capsules 3 times daily, carvedilol 6.25 mg p.o. twice daily, enalapril 10 mg p.o. twice daily, Symbicort 160/4.5, 2 puffs twice daily, Spiriva 1 inhalation once daily, warfarin 15 mg p.o. on Mondays, Wednesdays, Fridays and 10 mg all other days, atorvastatin 10 mg p.o. 3 times weekly at bedtime, tamsulosin 0.4 mg once daily, albuterol 2 puffs every 6 hours as needed, ketoconazole topical cream 32% to affected area once daily as needed, sildenafil 100 mg p.o. daily as needed. Nasal spray with sodium chloride nasal solution 0.65% solution 1 spray every 4 hours as needed, lorazepam 1 mg twice daily as needed, Tylenol 325 mg 2 tablets every 4 hours as needed, acetaminophen/hydrocodone 325 /5 mg 1 tablet every 6 hours as needed, ranitidine 150 mg p.o. twice daily, lactulose  10 grams in 15 mL oral syrup, 30 mL twice daily, Xifaxan 550  mg p.o. twice daily, Glucerna 237 mL 3 times daily with meals, and Zosyn 4.5 grams IV every 8 hours for 8 more days through 11/04/2013.   HOME OXYGEN: None.   DIET: 2 grams salt, low-fat, low-cholesterol, carbohydrate-controlled diet, mechanical soft.   ACTIVITY LIMITATIONS: As tolerated.   REFERRALS: To physical therapy, speech therapy as well as occupational therapy.    FOLLOWUP APPOINTMENT: With Dr. Orland Jarred in 1 week after discharge, Dr. Sampson Goon in 2 days after discharge.   CONSULTANTS: Care management, social work, Rhona Raider. Troxler, DPM; as well as Stann Mainland. Sampson Goon, MD, Annice Needy, MD, Renford Dills, MD  RADIOLOGIC STUDIES: Chest x-ray, portable single view, 10/27/2013: No active disease. Repeated chest x-ray 10/27/2013, portable single view, after PICC line placement: The right PICC line tip is relatively obscured by body habitus, limiting confident localization. Consider repeated AP view, minimizing patient rotation or, ideally, PA and lateral views.   HISTORY OF PRESENT ILLNESS:  The patient is an 69 year old, Caucasian male with past medical history significant for history of alcohol abuse, also history of chronic atrial fibrillation, on Coumadin therapy at home, who presents to the hospital with complaints of left foot osteomyelitis. Please refer to Dr. Suzanne Boron admission note on 10/18/2013. On arrival to the hospital, patient's vital signs were temperature 98.4, pulse was 112, respiration rate was 20, blood pressure 127/77, saturation was 96% on room air. Physical exam was remarkable for tachycardia, mild pedal edema and poor dorsalis pedis pulses bilaterally. The patient did have altered mental status, and the neurologic  exam was difficult to evaluate. The patient did have left foot ulceration with serosanguineous fluid with surrounding erythema. Ulcer base was revealing fifth metatarsal bone. The patient's lab  data done on admission, 10/18/2013, showed a sodium level of 132. Otherwise, BMP was unremarkable. The patient's urine drug screen was positive for cocaine as well as cannabinoids. The patient's white blood cell count was 8.8, hemoglobin was 11.4, platelet count 168,000. Absolute neutrophil count was 6.8. Coagulation panel revealed pro time of 17.1, INR was 1.4. Wound cultures taken, superficial cultures taken on 10/18/2013 showed Pseudomonas aeruginosa moderate growth sensitive to all antibiotics. He was admitted to the hospital for further evaluation.   HOSPITAL COURSE: He was started on IV broad-spectrum antibiotics with vancomycin as well as Zosyn. He underwent left foot fifth metatarsal head resection on 10/21/2013 by Dr. Orland Jarred, who followed the patient along. Postoperatively, the patient was given sedating medications as well as pain medications. Sedation was given for his alcohol withdrawal. With this, his mental status declined, and he developed acute hypercarbic respiratory failure. He was given Narcan as well as Flomax and some oxygen with some improvement of his condition. He was followed closely in the intensive care unit. He was also noted to be in atrial fibrillation, RVR, requiring Cardizem IV intermittent dosing. Slowly, with conservative therapy he improved. Since the patient's initial wound cultures taken on 10/18/2013 grew pseudomonas but no MRSA, vancomycin was discontinued, and Zosyn was continued. PICC line was placed on 10/23/2013. The patient's would cultures done during surgery, 10/21/2013, did not show any growth. We feel the patient would benefit from antibiotic therapy for 2 weeks from 10/21/2013 through 11/04/2013 with Zosyn. The patient is to continue to follow up with Dr. Sampson Goon for further recommendations in regard to management of his osteomyelitis. Dr. Orland Jarred followed the patient along while he was in the hospital and felt that his would was nicely healing. Dr. Orland Jarred saw  the patient in consultation on 10/25/2013 and felt that the patient's foot was stable with intact incision margin dorsal and clean wound plantarly. He recommended to start shoes for both feet to facilitate physical therapy. The patient had his wound repacked as well as redressed of the  foot on 10/26/2013. It is recommended to follow up with Dr. Orland Jarred for further recommendations in regards to wound management as outpatient.   In regards to acute renal failure, the patient was given IV fluids, and his kidney function normalized by the day of discharge. It was felt to be some dehydration as well as infection -related. As for atrial fibrillation, RVR, the patient was restarted on Coumadin therapy after surgery, and his INR is 1.2 on 10/27/2013. For COPD, the patient's COPD was stable. The patient is to continue his inhalation therapy. For diabetes mellitus, hemoglobin A1c was checked and was found to be 5.9. The patient would benefit from diabetic diet alone. No other medications were recommended. The patient was noted to be constipated, requiring enema administration on 10/26/2013, after which his constipation resolved. For hepatic encephalopathy, the patient is to continue lactulose as well as Xifaxan. His mental status improved significantly since admission.   The patient is being discharged in stable condition with the above-mentioned medications and follow-up. On the day of discharge, temperature was 98.7, pulse was 73, respirations 20, blood pressure 116/72, saturation was 94% on room air at rest.   TIME SPENT: 40 minutes on this patient.    ____________________________ Katharina Caper, MD rv:je D: 10/27/2013 13:26:00 ET T: 10/27/2013 14:25:45 ET JOB#:  [id number]  cc: Stann Mainlandavid P. Sampson GoonFitzgerald, MD Rhona RaiderMatthew G. Troxler, DPM Katharina Caperima Kaydince Towles, MD, <Dictator> Youssef Footman MD ELECTRONICALLY SIGNED 10/28/2013 17:02

## 2014-05-14 NOTE — Consult Note (Signed)
PATIENT NAME:  John Conrad, HELZER MR#:  [id number] DATE OF BIRTH:  Jun 15, 1945  DATE OF CONSULTATION:  10/20/2013  REFERRING PHYSICIAN:  Dr. Allena Katz.   CONSULTING PHYSICIAN:  Stann Mainland. Sampson Goon, MD  John Conrad FOR CONSULTATION: Osteomyelitis of the foot.   CONSULTING PODIATRIST:  Dr. Orland Jarred.  CONSULTING VASCULAR SURGEON:  Dr. Gilda Crease.   CONSULTING PSYCHIATRIST: Dr. Toni Amend.    HISTORY OF PRESENT ILLNESS: This is a 69 year old gentleman with history of diabetes as well as and hypertension, atrial fibrillation, neuropathy polysubstance abuse including alcohol, cocaine and marijuana, admitted from Dr. Racheal Patches office with drainage of the ulcer on the bottom of his foot and suspicion of osteomyelitis. He has been dealing with this infection in foot for several months now. He was seen last week in the podiatry clinic and started on Bactrim. The foot, however, worsened and he was brought to the clinic and admitted directly from there. He had also been having some falls. Since admission, he was found to have positive cocaine and marijuana. He is on CIWA protocol for heavy alcohol abuse as well. He has been seen by Dr. Gilda Crease who feels that vascular circulation is intact. He was unable to get an MRI due to his restless leg syndrome and unable to hold still.   PAST MEDICAL HISTORY: Diabetes, hypertension, diabetic neuropathy, atrial fibrillation, sleep apnea, COPD, cardiomyopathy, possible history of MRSA infection.   PAST SURGICAL HISTORY: Knee surgery.   SOCIAL HISTORY: He smokes and drinks apparently. Positive test for cocaine and marijuana.   ALLERGIES: No known drug allergies.   REVIEW OF SYSTEMS: Unable to be obtained.   PHYSICAL EXAMINATION:  VITAL SIGNS: Temperature 97.7, pulse 86, blood pressure 128/81, respirations 20, sat 96% on room air. He has been afebrile since admission.  GENERAL: He is obese, lying in bed, snoring loudly in his sleep, but he is arousable. He gives minimal history.   EYES: His pupils are reactive. Extraocular movements are intact.  OROPHARYNX: Clear.  NECK: Supple.  HEART: Regular with distant heart sounds.  LUNGS: Clear with no wheeze.  ABDOMEN: Obese, soft, nontender.  EXTREMITIES: On his left foot on the medial aspect he has an ulcer that is approximately 1 cm in diameter and deep and is draining purulence. The foot seems somewhat tender although he is not very cooperative.   DATA: White blood count on admission is 7.9, currently 7.4, hemoglobin 12.0, platelets 162. Renal function shows a creatinine of 0.72, ethanol level was less than 3. Urine toxicology screen was positive for cocaine and marijuana. A1c was 5.9. Culture data of the foot showed pseudomonas heavy growth sensitive to ciprofloxacin and all other tested IV antibiotics. Review of recent outpatient laboratories show no culture data, but he did have negative HIV test. A1c in June was 6.1.  IMPRESSION: A 69 year old gentleman with diabetes, relatively well-controlled diabetic neuropathy and an infected foot ulcer growing Pseudomonas. He has a deep wound that probes to bone. He could not tolerate an MRI and a bone scan is pending. He has been evaluated by podiatry for surgery. Dr. Gilda Crease has seen him and feels his circulation is intact.   He has positive cocaine and marijuana. I am hesitant to use home IV antibiotics in this patient given his substance abuse history. Fortunately, the pseudomonas is sensitive to ciprofloxacin. I do not see any evidence of MRSA or other infections. He is currently on vancomycin and Zosyn.   I would suggest continuing the vancomycin and Zosyn until final culture results. If no MRSA  or other organisms are identified he could be discharged on ciprofloxacin and Flagyl with duration depending on the findings on his potential surgery and bone scan.   Thank you for the consult. I will be glad to follow with you.    ____________________________ Stann Mainlandavid P. Sampson GoonFitzgerald,  MD dpf:JT D: 10/20/2013 21:09:13 ET T: 10/21/2013 01:49:13 ET JOB#: [id number]  cc: Stann Mainlandavid P. Sampson GoonFitzgerald, MD, <Dictator> Tod Abrahamsen Sampson GoonFITZGERALD MD ELECTRONICALLY SIGNED 10/24/2013 23:56

## 2014-05-14 NOTE — H&P (Signed)
PATIENT NAME:  John Conrad, John Conrad MR#:  [id number] DATE OF BIRTH:  [birth year]  DATE OF ADMISSION:  12/18/2013  PRIMARY CARE PHYSICIAN: Nonlocal.   REFERRING PHYSICIAN: Eryka A. Inocencio HomesGayle, MD  CHIEF COMPLAINT: Altered mental status.   HISTORY OF PRESENT ILLNESS: John Conrad is a 69 year old male who was admitted in September 2015 for left fifth metatarsal osteomyelitis, status post resection, and currently receiving IV antibiotic with Zosyn through a PICC line. He has been residing in a nursing home. Also has history of alcohol abuse, cirrhosis, atrial fibrillation on chronic anticoagulation with Coumadin, obesity, who was doing in his usual state of health until last night. The patient's family visited him last night and he was walking with the help of a walker. This morning, the patient hit his leg on the wheelchair and had bleeding from the right leg, for which reason, the patient was sent to the Emergency Department. Per family, the patient was sleeping in the Emergency Department. When the patient was sent back to the nursing home the patient was found to be in altered mental status, for which reason, the patient was sent back to the Emergency Department. Workup in the Emergency Department: No obvious signs of infection are noted. ABG showed pH of 7.41, pCO2 of 60, PaO2 of 61. For which reason, the patient was placed on BiPAP. CT head without contrast was unremarkable. Chest x-ray did not show any obvious infiltrates. No obvious signs of any infection are noted. Urine drug screen is negative. The patient, in the nursing home, is on scheduled Ativan 2 times a day, gabapentin 300 mg 3 times a day, and Percocet. I could not obtain any history from the patient. The history is mainly obtained from the patient's family and medical records from the previous admission.   PAST MEDICAL HISTORY:  1.  Hypertension.  2.  Chronic atrial fibrillation.  3.  COPD.  4.  Cardiomyopathy.  5.  Cirrhosis.  6.  History of  alcohol abuse.  7.  History of polysubstance abuse.  8.  Congestive heart failure.  9.  Osteomyelitis of the left foot and right foot.   PAST SURGICAL HISTORY:  1.  Knee surgery.  2.  Foot debridement.    ALLERGIES: No known drug allergies.   HOME MEDICATIONS:  1.  Coumadin 10 mg 1-1/2 tablets once a day   2.  Coumadin 10 mg on the other days   3.  Tamsulosin 0.4 mg once a day.  4.  Symbicort 2 puffs 2 times a day.  5.  Spiriva 18 mcg once a day.  6.  Sildenafil 100 mg once a day.  7.  Sertraline 100 mg once a day.  8.  Rifaximin 550 mg 2 times a day.  9.  Ranitidine 150 mg every 12 hours.  10.  Zosyn 4.5 mg every 8 hours.  11.  Lorazepam 1 mg 2 times a day.  12.  Lactulose 30 mL 2 times a day.  13.  Neurontin 900 mg 3 times a day.  14.  Enalapril 10 mg 2 times a day.  15.  Digoxin 125 mcg daily.  16.  Coreg 6.25 mg 2 times a day.  17.  Atorvastatin 10 mg 3 times a week.  18.  Albuterol 2 puffs every 6 hours as needed.  19.  Percocet 1 tablet every 6 hours as needed.   SOCIAL HISTORY: History of smoking. Drank alcohol and used marijuana and cocaine prior to the admission in September.   FAMILY  HISTORY: Could not be obtained from the patient.   REVIEW OF SYSTEMS: Could not be obtained secondary to altered mental status.   PHYSICAL EXAMINATION:  GENERAL: This is a well-built, well-nourished, chronically ill-looking male, lying down in the bed, on BiPAP.  VITAL SIGNS: Temperature 97.4, pulse 115, blood pressure 108/68, respiratory rate of 15, oxygen saturation 100% on BiPAP.  HEENT: Head: Normocephalic, atraumatic. There is no scleral icterus. Conjunctivae are normal. Pupils equal and react to light. Mucous membranes are moist. No pharyngeal erythema.  NECK: Supple. No lymphadenopathy. No JVD. No carotid bruit.  CHEST: No focal tenderness. Decreased breath sounds in the lower lobes.  HEART: S1, S2 regular. No murmurs are heard.  ABDOMEN: Bowel sounds are present. Soft,  nontender, nondistended. Could not appreciate any hepatosplenomegaly secondary to obesity.  EXTREMITIES: No pedal edema. He has a laceration of about 3 to 4 cm. The wound has mild oozing of blood consistent. Pulses 2+. NEUROLOGIC: The patient is not oriented to place, person, and time. Mild response to painful stimuli. Opens eyes, quickly falls asleep. Could not examine the motor and sensory.  SKIN: Anasarca.  MUSCULOSKELETAL: Could not examine secondary to the patient's altered mental status.   LABORATORY DATA: ABG, pH of 7.41, pCO2 of 59, PaO2 of 61. CBC: WBC of 5.1, hemoglobin 10.4, platelet count of 162,000. Rest of all the values are within normal limits.   ASSESSMENT AND PLAN: Mr. Smead is a 69 year old male who was brought to the Emergency Department with altered mental status.  1.  Altered mental status: This could be a combination of factors or a single factor. The patient is on multiple sedative medications. He has an underlying history of cirrhosis with previous episodes of hepatic encephalopathy. The patient's urine drug screen is negative. CT head does not show any stroke; however, could not examine motor. Admit the patient to a monitored bed. Continue with the BiPAP. Hold all sedative medications. Continue with intravenous fluids and follow up. If the patient does not improve with the mental status after holding the sedative medications, consider getting an MRI of the brain. The other possibility of hepatic encephalopathy. The patient's ammonia level is not high; however, considering the patient's underlying condition, we will start the patient on lactulose and follow up.  2.  Atrial fibrillation with rapid ventricular rate: Rate is moderately controlled. Heart rate is 115. This could be from not taking his medications. We will give digoxin intravenously and Lopressor and follow up. The patient is on anticoagulation with Coumadin. INR is therapeutic.  3.  History of cirrhosis: We will  continue with the lactulose.  4.  Hypertension, moderately controlled: Continue with enalapril. 5.  History of osteomyelitis: Continue with Zosyn.  6.  History of polysubstance abuse: Urine drug screen is negative now.  7.  The patient is already on therapeutic INR, which should provide deep venous thrombosis prophylaxis as well.   TIME SPENT: 60 minutes.    ____________________________ Susa Griffins, MD pv:ts Conrad: 12/18/2013 22:07:58 ET T: 12/18/2013 22:32:21 ET JOB#: [id number]  cc: Susa Griffins, MD, <Dictator> Susa Griffins MD ELECTRONICALLY SIGNED 12/31/2013 21:28

## 2014-05-14 NOTE — H&P (Signed)
PATIENT NAME:  John SanteeHOLMES, John Conrad MR#:  [id number] DATE OF BIRTH:  [birth year]  DATE OF ADMISSION:  10/18/2013   The patient's laboratories are available. pH is 7.27, CO2 is 64, pO2 112 and ABG is done on room air.  His other labs showed a Chem-7, sodium 132, potassium 4, chloride 98, bicarbonate 30, BUN 16, creatinine 0.9,  glucose 97. The patient's INR is 1.4. Alcohol level less than 3.   I have the medication list from his pharmacy. Atorvastatin 10 mg 3 times a week, Coreg 6.25 mg p.o. b.i.Conrad., digoxin 125 mcg 2 tablets daily, Enalapril 10 mg 2 times a day, furosemide 40 mg 2 tablets daily, Neurontin 300 mg p.o. t.i.Conrad., Ativan 1 mg p.o. b.i.Conrad., metformin 500 mg p.o. b.i.Conrad., Spiriva 18 mcg inhalation daily, Zoloft 100 mg p.o. daily, Bactrim 1 tablet p.o. b.i.Conrad. started on 09/22/2013 for 20 days, Symbicort 160/4.5 mcg inhalation 2 times daily, tamsulosin 0.4 mg daily, Coumadin 10 mg 1-1/2 tablets Monday/Wednesday/Friday and 1 tablet of 10 mg on the other days.   ASSESSMENT AND PLAN:  1.  The patient does have respiratory acidosis and altered mental status secondary to hypercapnia. The patient will be on BiPAP, and we will see how he responds.  2.  Regarding atrial fibrillation, INR is not therapeutic. We will continue the Lovenox 1 mg/kg and restart the Coumadin when he is more awake and alert, and also if CT head is negative for any bleed.  3.  Benign prostatic hypertrophy. Will restart Flomax 0.4 mg daily.    ____________________________ Katha HammingSnehalatha Kandyce Dieguez, MD sk:MT Conrad: 10/18/2013 12:12:07 ET T: 10/18/2013 12:44:15 ET JOB#: [id number]  cc: Katha HammingSnehalatha Adolphus Hanf, MD, <Dictator> Katha HammingSNEHALATHA Tyonna Talerico MD ELECTRONICALLY SIGNED 10/18/2013 15:36

## 2014-05-14 NOTE — Consult Note (Signed)
PATIENT NAME:  John Conrad, John Conrad MR#:  [id number] DATE OF BIRTH:  [birth year]  DATE OF CONSULTATION:  10/24/2013  CONSULTING PHYSICIAN:  John Needy, MD  REASON FOR CONSULTATION:  Evaluate his venous access.   HISTORY OF PRESENT ILLNESS:  This is a 69 year old, morbidly obese gentleman in the Critical Care Unit with infection in his foot and multiple other ongoing issues. He had a PICC line placed via the [employer] earlier this week, and it was unclear whether or not this was a usable device. The patient is sleeping and does not awaken easily and so the history is  obtained mostly from his nurse. He apparently had been admitted to the hospital several days before with left foot osteomyelitis, which has required debridement and surgery, and will require extended IV antibiotics. He has a long history of alcohol use and apparently had vodka in the hospital earlier on this admission. Apparently, his baseline of mental functioning is sleeping and snoring most of the time with episodes of alertness and this is his baseline per family. Again, the history was obtained from the previous medical record and the nursing staff. Apparently, the original PICC line was placed and would not withdraw. It was manipulated and 2 further x-rays have been obtained, but the nursing service does not know if this is a usable device.   PAST MEDICAL HISTORY: Significant for: 1.  Hypertension.  2.  Atrial fibrillation.  3.  COPD.   4.  Tobacco and alcohol abuse.  5.  History of cardiomyopathy.  6.  Osteomyelitis of the foot.   ALLERGIES: None known.    SOCIAL HISTORY: Apparently, he smokes and drinks regularly and had vodka in the hospital. Apparently, there is no reported use of illicit drugs.   PAST SURGICAL HISTORY: Significant for knee surgery and foot debridement.   MEDICATIONS:  Per the history and physical, include atorvastatin 10 mg every other day, Coreg 6.25 mg b.i.d., digoxin 125 mcg twice daily,  enalapril 10 mg twice daily, Lasix 40 mg twice daily, Neurontin 300 mg t.i.d., Ativan 1 mg b.i.d., metformin 500 mg b.i.d., Spiriva inhalation daily, Zoloft 100 mg daily, Bactrim double strength daily, Symbicort inhalation twice daily, tamsulosin 0.4 mg daily, Coumadin alternating 10 and 15 mg.   REVIEW OF SYSTEMS:  Is not obtainable due to the patient's mental status.   PHYSICAL EXAMINATION:   VITAL SIGNS: Show a temperature of 96.9, pulse of 74, blood pressure is 122/67, saturations are 100% on nasal cannula.  HEENT: Normocephalic and atraumatic. Eyes: Sclerae are anicteric. Conjunctivae are clear. Ears: Normal external appearance. Difficult to assess hearing as the patient really does not respond to voice.  SKIN:  Normal turgor and intact without obvious ulcers except for the left foot, which is bandaged.  NEUROLOGIC:  Lethargic and stuporous, not alert or oriented.  HEART:  Irregularly irregular with a murmur present.  LUNGS: Diminished particularly at the bases, but equal bilaterally.  ABDOMEN: Soft, nondistended, nontender.   EXTREMITIES:  PICC line comes out of the right arm. He has lower extremity swelling and the bandage on his foot.   LABORATORY EVALUATIONS:  Reveal a sodium of 130. Potassium is 3.9, chloride is 98, CO2 is 28, BUN is 15, creatinine is 1.41, glucose is 135. A CBC was done yesterday with a white blood count of 6.7, hemoglobin is 12.9, and platelet count is 244,000.   IMAGING: Chest x-ray shows the tip of the PICC line in the right arm to be in the  distal superior vena cava without pneumothorax, and cardiomegaly is present.   ASSESSMENT:  1.  This PICC line is in excellent location for use and can be used regularly for his venous access both now and in the future for his IV antibiotics.  2.  Multiple other medical issues being managed by primary service.   PLAN:  Use PICC line as needed.  We will sign off. Please call with questions. This is a level 3 consultation.      ____________________________ John NeedyJason S. Tahra Hitzeman, MD jsd:lr D: 10/25/2013 15:38:27 ET T: 10/25/2013 16:25:50 ET JOB#: [id number]  cc: John NeedyJason S. Leona Alen, MD, <Dictator> John NeedyJASON S Isamar Nazir MD ELECTRONICALLY SIGNED 11/02/2013 10:23

## 2014-05-14 NOTE — Op Note (Signed)
PATIENT NAME:  John Conrad, John Conrad MR#:  [id number] DATE OF BIRTH:  03/05/1945  DATE OF PROCEDURE:  10/21/2013  PREOPERATIVE DIAGNOSIS: Osteomyelitis, fifth metatarsal head, left foot.  POSTOPERATIVE DIAGNOSIS: Osteomyelitis, fifth metatarsal head, left foot.   PROCEDURE: Ostectomy fifth metatarsal head, left foot.   SURGEON: Rhona RaiderMatthew G. Jawuan Robb, DPM.   ASSISTANT: None.   HISTORY OF PRESENT ILLNESS: The patient has had an open wound on the left foot for a couple of months treated conservatively but it continued to break down and eventually was infected and we had to admit him to the hospital on Monday. He tested positive for cocaine and this had to be cleared out of his system before we could do surgery and he has been on IV antibiotics.   ANESTHESIA: Local with MAC.   ANESTHESIOLOGIST: Dr. Henrene HawkingKephart.   ESTIMATED BLOOD LOSS: 20 mL.   DESCRIPTION OF PROCEDURE: The patient was brought to the Operating Room and placed on the OR table in the supine position. At this point after local anesthesia was achieved, the patient was prepped and draped in the usual sterile manner. Attention was then directed to the fifth metatarsophalangeal joint and distal shaft and head of the metatarsal where a 4 cm linear incision was made and deepened with sharp and blunt dissection. Bleeders were clamped and bovied as required. The capsular tissue overlying the first metatarsophalangeal joint was noted and incised longitudinally and freed away from the dorsal, medial, and lateral aspects of the metatarsal head and neck area. At this point, power equipment was utilized to resect the fifth metatarsal head and distal shaft area. This was removed, inspected and good bone quality was noted. Some breakdown of bone was noted at the plantar osteochondral metatarsal head. This area was cultured at this time frame. At this point, a VersaJet was used to clean up and remove damaged and infected soft tissue. Once the wounds looked clean,  the area was then copiously irrigated and capsular tissue was closed with 3-0 Vicryl in simple interrupted fashion. The deep and superficial fascia was closed with 3-0 Vicryl in simple interrupted fashion also. The skin was closed with 5-0 nylon horizontal mattress sutures. The plantar wound was packed with saline-soaked gauze and a sterile compressive dressing was then placed across the left foot consisting of Xeroform gauze dorsally,  4 x 4's, conFORM, 2 ABD pads, Kerlix, and an Ace wrap. The patient left the OR for the recovery room. The tourniquet had been released prior to complete closure of the dorsal wound and prompt and complete vascularity was seen to return to all digits to the left foot. The patient tolerated the procedure and anesthesia well and left the OR for the recovery room with vital signs stable and neurovascular status intact.    ____________________________ Rhona RaiderMatthew G. Oseas Detty, DPM mgt:at Conrad: 10/21/2013 11:33:30 ET T: 10/21/2013 13:21:01 ET JOB#: [id number]  cc: Rhona RaiderMatthew G. Mikenzie Mccannon, DPM, <Dictator> Epimenio SarinMATTHEW G Akeira Lahm MD ELECTRONICALLY SIGNED 11/09/2013 10:08

## 2014-05-14 NOTE — H&P (Signed)
PATIENT NAME:  John Conrad, John Conrad MR#:  [id number] DATE OF BIRTH:  02/15/1945  DATE OF ADMISSION:  10/18/2013  PRIMARY CARE PHYSICIAN: A doctor from [place].  The patient is a direct admission from Dr. Racheal Patches office.   REASON FOR ADMISSION: Left foot osteomyelitis.   HISTORY OF PRESENT ILLNESS: A 69 year old male patient with hypertension, chronic atrial fibrillation, type 2 diabetes mellitus with neuropathy brought in from Dr. Racheal Patches office as a direct admit for infection of the left foot.    The patient has had an ulcer on the bottom of the left foot for a long time that recently got worse with heavy drainage. The patient saw Dr. Orland Jarred last week and he started the patient on Bactrim. Today, he went for followup. Dr. Orland Jarred looked at the wound and he did a probe test, which was positive for osteomyelitis infection all the way up to the bone. Dr. Orland Jarred said he had heavy drainage and he needs fifth metatarsal head amputation, so we are admitting him for that.   The patient is seen in the room and patient noted to have a bottle of vodka in his pocket and was snoring and heavily sedated.   Patient's history obtained from his sister and also one of his close friends. According to his sister, the patient lives alone. He has diabetes, chronic atrial fibrillation. Does not take medications for diabetes on a regular basis and patient has sleep apnea and CPAP machine at home. Usually his baseline will be snoring and sleeping all the time with episodes of alertness, which is his usual status. The patient lives alone. Has a caregiver at home and, according to the patient's sister, he eats well and ambulates minimally.  PAST MEDICAL HISTORY: Significant for hypertension, chronic atrial fibrillation, history of COPD and history of cardiomyopathy.   ALLERGIES: No known allergies.   SOCIAL HISTORY: The patient smokes and drinks. We do not know how much he smokes, because the patient's sister does not  know. All she knows is that he does smoke and he drinks on a daily basis. Does not use any drugs.   PAST SURGICAL HISTORY: Significant for knee surgery.   MEDICATIONS: The medication list is not available yet. We are in the process of getting it from [facility]. The patient's medication list will be obtained and updated once we get it.   He was seen on September 15 in the ER for a fall. The patient's CT head was normal.   For medications, we know that he takes digoxin, Coumadin, Symbicort, Spiriva, lisinopril, metformin and glipizide, but the doses are not confirmed. He was also taking Bactrim for foot infection.   REVIEW OF SYSTEMS: Not obtainable because of his altered mental status.   PHYSICAL EXAMINATION:  VITAL SIGNS: Temperature 98.4, heart rate 112. Blood pressure 127/77, saturation 96% on room air.  GENERAL: The patient is a 69 year old male, well nourished, not in distress, but he is sleeping and snoring, not able to wake up.  HEENT: Pupils reacting to light. No conjunctivitis. No sclerae icterus. The patient's tympanic membranes are clear. The patient has no turbinate hypertrophy, no oropharyngeal erythema.  NECK: Thyroid not enlarged. Supple. No masses. No lymphadenopathy. No JVD.  RESPIRATORY: The patient has mild bilateral expiratory wheeze in all lung fields.  CARDIOVASCULAR: S1, S2 regular and slightly tachycardic. The patient does have 2+ pedal edema. Poor dorsalis pedal pulse bilaterally.  ABDOMEN: Soft, obese. Bowel sounds present. No organomegaly. No hernias.  MUSCULOSKELETAL: The patient has bilateral  pitting edema. Unable to examine fully for strength because of altered mental status.  SKIN: The patient has a skin ulcer noted on the bottom of the left foot, about 3 x 2 cm, with mild serosanguineous and an are of surrounding erythema present. Ulcer base extending to the metatarsal bone.  NEUROLOGIC: The patient is unable to participate in neurological exam because of  altered mental status. Sight not tested because of altered mental status.   LABORATORY DATA: The patient's EKG shows atrial fibrillation with a rate of 99 beats per minute.   WBC 8.8, hemoglobin 11.4, hematocrit 36.1 and platelets 168,000. Glucose 103.   Other labs are pending.   ASSESSMENT AND PLAN: A 69 year old male with:  1. Diabetic left foot infection with possible osteomyelitis. The patient is admitted to medical service. I have spoken with Dr. Orland Jarredroxler who recommended MRI of the left foot and he wanted the patient on vancomycin and Zosyn. Cultures have been obtained this morning in his office and sent to the laboratory here. Please follow the cultures. Continue vancomycin and Zosyn. Dr. Orland Jarredroxler will see the patient for possible fifth metatarsal head amputation.  2. Type 2 diabetes mellitus, noncompliant with medication. Check hemoglobin A1c. Continue sliding scale with coverage. Obtain home medication list. Continue n.p.o. because of his altered mental status needed. Hold p.o. diabetic medications. Restart when he is more alert. Until then, continue IV fluids and sliding scale with coverage.  3. Altered mental status, likely due to a combination of alcohol intoxication and also taking Ativan. According to family, he does take Ativan on a regular basis. The patient also has sleep apnea, so we are going to check the ABG, resume CPAP, put him on CIWA protocol, and get a CT of the head.  4. Atrial fibrillation with rapid ventricular response. The patient will be on IV digoxin at this time. The patient is having altered mental status, so we will continue IV medication with digoxin and also beta blockers. If he gets more alert, we will resume p.o. medications.  5. Atrial fibrillation, which is chronic. He is on Coumadin at home. I do not know the doses. We will check PT/INR and check CT head to make sure there is no abnormality and the patient's Coumadin. Will be on hold continue until I know the  PT/INR results and CT head results.  6. Chronic obstructive pulmonary disease. The patient does have slight wheezing. Continue Spiriva, Symbicort and nebulizers.  7. Alcohol abuse. The patient will be on CIWA protocol and we will counsel him when he gets more alert.  Marland Kitchen.   CODE STATUS: Full code. I have spoken with the patient's sister.   TIME SPENT: About 60 minutes on the history and physical.    ____________________________ Katha HammingSnehalatha Romone Shaff, MD sk:ah D: 10/18/2013 11:45:07 ET T: 10/18/2013 12:13:51 ET JOB#: [id number]  cc: Katha HammingSnehalatha Lithzy Bernard, MD, <Dictator> Rhona RaiderMatthew G. Troxler, DPM Katha HammingSNEHALATHA Zev Blue MD ELECTRONICALLY SIGNED 10/18/2013 15:37

## 2014-05-14 NOTE — Consult Note (Signed)
See full dictated note to follow for full details.short, asked to evaluate his venous accessmany with multiple ongoing issues who had a PICC line placed by the service.  Nursing staff told intially to use the line, then that the line was not useable, and then no information given after last XRAY.  The last Xray is reviewed and the tip is in the SVC in an adequate location to use for venous access at this point.    Electronic Signatures: Annice Needyew, Jason S (MD)  (Signed on 04-Oct-15 10:01)  Authored  Last Updated: 04-Oct-15 10:01 by Annice Needyew, Jason S (MD)

## 2014-05-14 NOTE — Consult Note (Signed)
General Aspect foot ulcer associated with decreased pedal pulse   Present Illness The John Conrad is a 69 year old male patient with hypertension, chronic atrial fibrillation, type 2 diabetes mellitus with neuropathy brought into the hospital yesterday from Dr. Elvina Mattes???s office as a direct admit for infection of the left foot.    The patient has an ulcer on the bottom of the left foot for several months (although is is very vague abot it) that recently got worse.  He note very heavy drainage lately. The patient saw Dr. Elvina Mattes last week and he started the patient on Bactrim. In the office examination of the wound yielded clincial evidence of osteomyelitis therefore the patient is admitted for IV antibiotics and surgery.  No history of fevers or shaking chills at home or rigors.  When asked about claudication he states his legs hurt all the time all over.  No past interventions or surgery for vascualr issues.   PAST MEDICAL HISTORY: Significant for hypertension, chronic atrial fibrillation, history of COPD and history of cardiomyopathy and alcohol dependence    No Known Allergies:   Case History:  Family History Non-Contributory   Social History positive  tobacco, positive ETOH, negative Illicit drugs   Review of Systems:  Fever/Chills No   Cough No   Sputum No   Abdominal Pain No   Diarrhea No   Constipation No   Nausea/Vomiting No   SOB/DOE No   Chest Pain No   Telemetry Reviewed Afib   Dysuria No   Physical Exam:  GEN well developed, well nourished, disheveled, obese   HEENT hearing intact to voice, dry oral mucosa   NECK supple  trachea midline   RESP normal resp effort  no use of accessory muscles   CARD irregular rate  no JVD   ABD obese, nondistended   EXTR positive edema, ulcer plantaer surface left 5th ray, DP 1+ Rt and nonpalpable left; PT 2+ bilaterally   SKIN positive rashes, positive ulcers, skin turgor poor   NEURO follows commands,  motor/sensory function intact   PSYCH alert, poor insight   Nursing/Ancillary Notes: **Vital Signs.:   29-Sep-15 05:53  Vital Signs Type Routine  Temperature Temperature (F) 98.5  Celsius 36.9  Temperature Source oral  Pulse Pulse 105  Respirations Respirations 18  Systolic BP Systolic BP 122  Diastolic BP (mmHg) Diastolic BP (mmHg) 79  Mean BP 93  Pulse Ox % Pulse Ox % 95  Pulse Ox Activity Level  At rest  Oxygen Delivery Room Air/ 21 %   Routine Micro:  28-Sep-15 09:30   Organism Name GRAM NEGATIVE ROD  Organism Quantity MODERATE GROWTH  Micro Text Report WOUND AER/ANAEROBIC CULT   ORGANISM 1                MODERATE GROWTH GRAM NEGATIVE ROD   COMMENT                   ID TO FOLLOW SENSITIVITIES TO FOLLOW   GRAM STAIN                MODERATE WHITE BLOOD CELLS   GRAM STAIN                FEW GRAM POSITIVE ROD   GRAM STAIN                FEW GRAM NEGATIVE ROD   ANTIBIOTIC  Specimen Source LEFT FOOT  Organism 1 MODERATE GROWTH GRAM NEGATIVE ROD  Culture Comment ID TO FOLLOW SENSITIVITIES TO FOLLOW  Gram Stain 1 MODERATE WHITE BLOOD CELLS  Gram Stain 2 FEW GRAM POSITIVE ROD  Gram Stain 3 FEW GRAM NEGATIVE ROD  Result(s) reported on 19 Oct 2013 at 01:35PM.  Lab:  28-Sep-15 11:40   pH (ABG)  7.27 (7.350-7.450 NOTE: New Reference Range 08/14/13)  PCO2  64 (32-48 NOTE: New Reference Range 08/31/13)  PO2  112 (83-108 NOTE: New Reference Range 08/14/13)  FiO2 28  Base Excess 0.9 (-3-3 NOTE: New Reference Range 08/31/13)  HCO3  29.4 (21.0-28.0 NOTE: New Reference Range 08/14/13)  O2 Saturation 96.9  O2 Device   Specimen Site (ABG) LT RADIAL  Specimen Type (ABG) ARTERIAL  Patient Temp (ABG) 37.0 (Result(s) reported on 18 Oct 2013 at 11:51AM.)  Lactic Acid, Arterial, Cardiopulmonary 0.8 (0.3-0.8 NOTE: New Reference Range 08/31/13)    17:00   pH (ABG) 7.38 (7.350-7.450 NOTE: New Reference Range 08/14/13)  PCO2 47 (32-48 NOTE: New Reference  Range 08/31/13)  PO2 108 (83-108 NOTE: New Reference Range 08/14/13)  FiO2 30  Base Excess 2.0 (-3-3 NOTE: New Reference Range 08/31/13)  HCO3 27.8 (21.0-28.0 NOTE: New Reference Range 08/14/13)  O2 Saturation 97.2  O2 Device v60  Specimen Site (ABG) RT RADIAL  Specimen Type (ABG) ARTERIAL  Patient Temp (ABG) 37.0 (Result(s) reported on 18 Oct 2013 at 05:17PM.)  Cardiology:  28-Sep-15 10:55   Ventricular Rate 99  Atrial Rate 156  QRS Duration 106  QT 374  QTc 479  R Axis 47  T Axis -91  ECG interpretation Atrial fibrillation Incomplete right bundle branch block Nonspecific T wave abnormality Prolonged QT Abnormal ECG When compared with ECG of 05-Oct-2013 15:47, Nonspecific T wave abnormality now evident in Inferior leads Confirmed by PARASCHOS,ALEX ([id number]) on 10/18/2013 4:00:32 PM  Overreader: Miquel Dunn  Routine Chem:  28-Sep-15 11:02   Glucose, Serum 97  BUN 16  Creatinine (comp) 0.94  Sodium, Serum  132  Potassium, Serum 4.0  Chloride, Serum 98  CO2, Serum 30  Calcium (Total), Serum 8.6  Anion Gap  4  Osmolality (calc) 266  eGFR (African American) >60  eGFR (Non-African American) >60 (eGFR values <27m/min/1.73 m2 may be an indication of chronic kidney disease (CKD). Calculated eGFR, using the MRDR Study equation, is useful in  patients with stable renal function. The eGFR calculation will not be reliable in acutely ill patients when serum creatinine is changing rapidly. It is not useful in patients on dialysis. The eGFR calculation may not be applicable to patients at the low and high extremes of body sizes, pregnant women, and vetetarians.)  Ethanol, S. < 3 (Result(s) reported on 18 Oct 2013 at 11:31AM.)  29-Sep-15 06:10   Hemoglobin A1c ([facility]) 5.9 (The American Diabetes Association recommends that a primary goal of therapy should be <7% and that physicians should reevaluate the treatment regimen in patients with HbA1c values consistently >8%.)   Glucose, Serum 91  BUN 11  Creatinine (comp) 0.87  Sodium, Serum  131  Potassium, Serum 4.0  Chloride, Serum 98  CO2, Serum 27  Calcium (Total), Serum  8.3  Anion Gap  6  Osmolality (calc) 262  eGFR (African American) >60  eGFR (Non-African American) >60 (eGFR values <617mmin/1.73 m2 may be an indication of chronic kidney disease (CKD). Calculated eGFR, using the MRDR Study equation, is useful in  patients with stable renal function. The eGFR calculation will not be reliable  in acutely ill patients when serum creatinine is changing rapidly. It is not useful in patients on dialysis. The eGFR calculation may not be applicable to patients at the low and high extremes of body sizes, pregnant women, and vetetarians.)  Urine Drugs:  37-TKW-40 97:35   Tricyclic Antidepressant, Ur Qual (comp) NEGATIVE (Result(s) reported on 18 Oct 2013 at 12:21PM.)  Amphetamines, Urine Qual. NEGATIVE  MDMA, Urine Qual. NEGATIVE  Cocaine Metabolite, Urine Qual. POSITIVE  Opiate, Urine qual NEGATIVE  Phencyclidine, Urine Qual. NEGATIVE  Cannabinoid, Urine Qual. POSITIVE  Barbiturates, Urine Qual. NEGATIVE  Benzodiazepine, Urine Qual. NEGATIVE (----------------- The URINE DRUG SCREEN provides only a preliminary, unconfirmed analytical test result and should not be used for non-medical  purposes.  Clinical consideration and professional judgment should be  applied to any positive drug screen result due to possible interfering substances.  A more specific alternate chemical method must be used in order to obtain a confirmed analytical result.  Gas chromatography/mass spectrometry (GC/MS) is the preferred confirmatory method.)  Methadone, Urine Qual. NEGATIVE  Routine Coag:  28-Sep-15 11:02   Prothrombin  17.1  INR 1.4 (INR reference interval applies to patients on anticoagulant therapy. A single INR therapeutic range for coumarins is not optimal for all indications; however, the suggested range for  most indications is 2.0 - 3.0. Exceptions to the INR Reference Range may include: Prosthetic heart valves, acute myocardial infarction, prevention of myocardial infarction, and combinations of aspirin and anticoagulant. The need for a higher or lower target INR must be assessed individually. Reference: The Pharmacology and Management of the Vitamin K  antagonists: the seventh ACCP Conference on Antithrombotic and Thrombolytic Therapy. HGDJM.4268 Sept:126 (3suppl): [id number]. A HCT value >55% may artifactually increase the PT.  In one study,  the increase was an average of 25%. Reference:  "Effect on Routine and Special Coagulation Testing Values of Citrate Anticoagulant Adjustment in Patients with High HCT Values." American Journal of Clinical Pathology 2006;126:400-405.)  Routine Hem:  28-Sep-15 11:02   WBC (CBC) 8.8  RBC (CBC)  3.60  Hemoglobin (CBC)  11.4  Hematocrit (CBC)  36.1  Platelet Count (CBC) 168  MCV 100  MCH 31.8  MCHC  31.7  RDW  16.5  Neutrophil % 77.0  Lymphocyte % 8.8  Monocyte % 12.9  Eosinophil % 0.9  Basophil % 0.4  Neutrophil #  6.8  Lymphocyte #  0.8  Monocyte #  1.1  Eosinophil # 0.1  Basophil # 0.0 (Result(s) reported on 18 Oct 2013 at 11:26AM.)  29-Sep-15 06:10   WBC (CBC) 7.9  RBC (CBC)  3.61  Hemoglobin (CBC)  12.0  Hematocrit (CBC)  36.4  Platelet Count (CBC) 173  MCV  101  MCH 33.3  MCHC 33.1  RDW  16.5  Neutrophil % 79.0  Lymphocyte % 7.3  Monocyte % 10.8  Eosinophil % 2.3  Basophil % 0.6  Neutrophil # 6.3  Lymphocyte #  0.6  Monocyte # 0.9  Eosinophil # 0.2  Basophil # 0.0 (Result(s) reported on 19 Oct 2013 at 07:07AM.)    Impression 1. Atherosclerosis of the lower extremities with ulceration of left foot.  The patient does have a palpable PT pulse and therfore has adequate perfusion for surgery and wound healing.  The lack of a DP pulse on the left and the diminished DP pulse on the right suggest mild PAD.  No angiography or  interventin at this time.  He is cleared for foot surgery from a vascular stand point.  He  should follow up in the office for baseline noninvasive studies as an outpatient. 2. Diabetic left foot infection with possible osteomyelitis. The patient is on the medical service. He did not tollerate MRI of the left foot.  The patient is on vancomycin and Zosyn. Cultures have been obtained.  Dr. Elvina Mattes will see the patient for possible fifth metatarsal head amputation.  3. Type 2 diabetes mellitus, noncompliant with medication. Check hemoglobin A1c. Continue sliding scale with coverage. Restart home medications when he is more alert. Until then, continue IV fluids and sliding scale with coverage.  4. Atrial fibrillation with rapid ventricular response. The patient will be on IV digoxin at this time. The patient is having altered mental status, so we will continue IV medication with digoxin and also beta blockers. If he gets more alert, we will resume p.o. medications.  5. Cardiomyopathy He is on Coumadin at home, check PT/INR  6. Chronic obstructive pulmonary disease. The patient does have slight wheezing. Continue Spiriva, Symbicort and nebulizers.  7. Alcohol abuse. The patient will be on CIWA protocol and we will counsel him when he gets more alert.   Plan level 3 consult   Electronic Signatures: Hortencia Pilar (MD)  (Signed 29-Sep-15 16:24)  Authored: General Aspect/Present Illness, Allergies, History and Physical Exam, Vital Signs, Labs, Impression/Plan   Last Updated: 29-Sep-15 16:24 by Hortencia Pilar (MD)

## 2014-05-14 NOTE — Consult Note (Signed)
PATIENT NAME:  John Conrad, BUN MR#:  [id number] DATE OF BIRTH:  December 24, 1945  DATE OF CONSULTATION:  10/18/2013  CONSULTING PHYSICIAN:  Rhona Raider. Cayley Pester, DPM  REASON FOR CONSULTATION: The patient was direct admitted as per my request for likely osteomyelitis to the left foot. He has had a diabetic ulceration on the left foot for a couple of months and it has progressively worsened. He was placed on Bactrim last week, but the wound has continued to worsen and drain. He presented today with a worsening type of infection that probed down to bone with the culture swab. At this point, I decided that it would be a good idea to admit him. He had done some falling in recent weeks, most recently about last week he fell, had a head injury and I sent him to the ER for CT scans, which were negative for any type of skull or orbital fractures. He has got multiple medical issues. He has a tendency to fall asleep very easily.   PAST MEDICAL HISTORY: Includes hypertension, diabetes, poorly controlled, diabetic neuropathy, atrial fibrillation as well as a bundle branch block, history of sleep apnea, history of possible MRSA infection in the past, history of COPD, and cardiomyopathy.   ALLERGIES: NKDA.    SOCIAL HISTORY: He is positive for smoking. Positive for alcohol abuse, and recent lab tests were positive for cocaine as well as marijuana.    PAST SURGICAL HISTORY: Positive for knee surgery.   He is 69 years old.  At the time of his admission, he was not alert. He was sedate and sleepy and was unable to answer questions. At his observation, when I came into the room, he was much more alert and felt much better from an overall standpoint.   LABORATORY RESULTS AND IMAGING: Show significant atrial fibrillation on EKG along with an incomplete right bundle branch block.  His white count is good at 8.8, red blood cells are a little low at 3.6 with a hemoglobin of 11.4 and hematocrit of 36.1.  He has got a slight left  shift through his neutrophils at this point.  Renal function appears fairly good with BUN at 16 and creatinine at 0.94.  Urine drug screen did yield positive cocaine and positive cannabinoid.   PHYSICAL EXAMINATION:  GENERAL:   He is a 69 year old Caucasian male in no apparent distress right now.  VITAL SIGNS:  His temperature is 98.4, pulse was 112, respirations 16, and blood pressure is 137/77, pulse oximetry at 96%.  Overall, he is stable and, at this point, he is alert and well-oriented.  VASCULAR: Lower extremity exam: The patient has significant venostasis disease to both lower extremities with edema, swelling, and discoloration to his legs. No venostasis ulcers at  present.  DERMATOLOGIC:  The patient has a deep-draining ulcer of submetatarsal 5 on the left foot with a hyperkeratotic pre-ulcerative-type area of submetatarsal 5 on the right foot. There is not a lot of cellulitis to the left foot. No tracking, but the wound has progressed to the left foot to the point where I think it is down to bone. I cultured this area earlier in the office and probed to the bone. His ulcer has gotten worse over the last 3 weeks. At that point, I elected to send him to the Emergency Room for direct admission.   CLINICAL IMPRESSION: Deep diabetic ulceration, left foot, with likely osteomyelitis left fifth metatarsal head.   TREATMENT PLAN: We will get vascular consult along with an MRI and plan  on probably doing a fifth metatarsal head resection.  We were thinking about doing that at one point anyway to clear the ulcer up, and this forces our hand to expedite the process. We will see what the MRI shows us. I want to get input from vascular and see if they feel he needs any type of interventional process to increase blood flow to that foot. I will follow the patient tomorrow.   ____________________________ Rhona RaiderMatthew G. Clinton Dragone, DPM mgt:lr D: 10/18/2013 17:34:23 ET T: 10/18/2013 19:23:51  ET JOB#: [id number]  cc: Rhona RaiderMatthew G. Lusero Nordlund, DPM, <Dictator> Epimenio SarinMATTHEW G Hamlin Devine MD ELECTRONICALLY SIGNED 11/09/2013 10:08

## 2014-05-14 NOTE — Consult Note (Signed)
PATIENT NAME:  John Conrad, John Conrad MR#:  [id number] DATE OF BIRTH:  12-29-1945  DATE OF CONSULTATION:  10/19/2013  REFERRING PHYSICIAN:   CONSULTING PHYSICIAN:  Audery AmelJohn T. Zoiee Wimmer, MD  IDENTIFYING INFORMATION AND REASON FOR CONSULTATION: A 69 year old man who came into the hospital for what appears to be a foot infection. Consult for polysubstance abuse.   HISTORY OF PRESENT ILLNESS: Information obtained from the patient and the chart. The patient came into the hospital for an infection in his foot. He was found to have a bottle of vodka on his person when he came in. Drug screen is positive as well. Concern was raised for substance abuse. The patient states that he has been depressed and feeling worse for the last couple of months. His wife left him several months ago. 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 He feels tired much of the time. He denies having any suicidal ideation. Says that he sleeps reasonably well. Admits that he has been using more drugs, especially marijuana and cocaine recently. Not getting any outpatient psychiatric treatment. He says that his drinking usually is confined to the weekend when he will binge. Does not drink every day.   PAST PSYCHIATRIC HISTORY: No history of mental health or substance abuse treatment in the past. No history of hospitalization. No history of suicide attempts. Not been on any medication for any mental health concerns.   PAST MEDICAL HISTORY: The patient has diabetes. Sugars seem to have been more difficult to control recently. He also has a history of hypertension, atrial fibrillation and neuropathy.   FAMILY HISTORY: Denies any family history of mental illness.   SOCIAL HISTORY: Living by himself. He has 1 adult child and 2 grandchildren and has positive feelings for them. Wife left him recently. He is not entirely sure why, but he thinks that their large difference in age had something to do with it. Denies  that his drinking had anything to do with it.   CURRENT MEDICATIONS: Symbicort 2 puffs twice a day, insulin sliding scale, nitroglycerin ointment p.r.n., Zantac 150 mg twice a day, Spiriva HandiHaler 1 capsule daily, Coreg 6.25 mg a day, digoxin 0.125 mg per day.   ALLERGIES: No known drug allergies.   REVIEW OF SYSTEMS: Feeling tired and run down. Denies feeling depressed. Denies suicidal or homicidal ideation. Denies hallucinations. Pain in the foot. Otherwise, review of systems negative.   MENTAL STATUS EXAMINATION: A somewhat disheveled gentleman who looks his stated age, cooperative with the interview. Eye contact good. Psychomotor activity, slow and limited. Speech decreased in total amount. Affect blunted and flat. Mood stated as being all right. The patient appears to be pretty sleepy, he has trouble keeping his eyes open at some point but does make an effort. He is alert and oriented x 4. His affect is flat. Denies suicidal or homicidal ideation. Denies any auditory or visual hallucinations. He can recall 3/3 objects immediately, only 1/3 at 3 minutes. Long-term memory grossly intact. Alert and oriented x 4. Judgment and insight reasonably good.   LABORATORY RESULTS: Drug screen was positive for cocaine and cannabis. There were multiple blood gases done. Multiple glucoses. Hemoglobin A1c is only 5.9. so that has been under pretty good control. Hematocrit low at 36.4, white count normal. Head CT has some left periorbital swelling, but no intracranial process.   VITAL SIGNS: Currently blood pressure 153/74, respirations 19, pulse 111, temperature 98.   ASSESSMENT: This is a 69 year old  man with alcohol abuse, as well as now cocaine and marijuana abuse. Not suicidal. Not clearly depressed. Shows reasonably good insight. No history of delirium tremens or seizures. No appearance of any delirium.   TREATMENT PLAN: Does not require inpatient psychiatric treatment. Does not require psychiatric  medicine. Reviewed situation with the patient. Counseling, including supportive and educational therapy done. We should refer him with information about outpatient substance abuse treatment at discharge. I have put in orders for the detoxification protocol just to make sure that that is covered in case he has more withdrawal than I would anticipate. We will follow up in the hospital.   DIAGNOSIS, PRINCIPAL AND PRIMARY:  AXIS I:  1.  Alcohol abuse.  2.  Cocaine abuse.  3.  Marijuana abuse.  AXIS II: Deferred.   ____________________________ Audery Amel, MD jtc:TT Conrad: 10/19/2013 17:21:44 ET T: 10/19/2013 17:37:51 ET JOB#: [id number]  cc: Audery Amel, MD, <Dictator> Audery Amel MD ELECTRONICALLY SIGNED 10/28/2013 16:09

## 2014-05-14 NOTE — Discharge Summary (Signed)
PATIENT NAME:  John Conrad, John Conrad MR#:  [id number] DATE OF BIRTH:  08-17-1945  DATE OF ADMISSION:  12/18/2013 DATE OF DISCHARGE:  12/20/2013  PRIMARY CARE PHYSICIAN: Dr. Maryellen Pile [facility])  CHIEF COMPLAINT: Altered mental status.   DISCHARGE DIAGNOSES: 1.  Altered mental status, suspected due to CO2 narcosis, resolved. The patient back at baseline.  2.  History of chronic atrial fibrillation, on Coumadin, with therapeutic INR.  3.  History of cirrhosis of liver, stable.  4.  Hypertension, stable.  5.  History of osteomyelitis. Completed antibiotic treatment at the nursing home. This was confirmed with the patient's nurse, Morrie Sheldon, at [facility].  6.  History of polysubstance abuse in the past.  7.  History of diabetes. Sugar stable. Not on any medications.  CODE STATUS: FULL code.   DIET: 2 grams sodium, ADA 1800 calorie diet.   DISCHARGE INSTRUCTIONS:  1.  The patient advised to use CPAP during nighttime and during afternoon naps.  2.  Use oxygen 1 liter per minute p.r.n. for symptom management.  3.  Right lower extremity wound care per protocol.  4.  Physical therapy.  5.  Follow up with Dr. Orland Jarred and Dr. Sampson Goon for left lower extremity wound as per your appointment.   DISCHARGE MEDICATIONS: 1.  Tylenol 650 q. 4 p.r.n.  2.  Bactroban 2% topical ointment every 2 days as per instructions.  3.  Coreg 6.25 b.i.d.  4.  Digoxin 0.125 p.o. daily.  5.  Lasix 40 mg b.i.d.  6.  Neurontin 300 mg capsules 3 capsules 3 times a day which is 900 mg t.i.d.  7.  Norco 5/325 one q. 4 p.r.n.  8.  Lactulose 30 mL p.o. b.i.d. p.r.n.  9.  Atorvastatin 10 mg p.o. daily.  10.  Potassium chloride 20 mEq p.o. daily.  11.  Phenergan 25 mg q. 6 p.r.n.  12.  Promethazine 25 mg q. 6 p.r.n.  13.  Ranitidine 150 mg b.i.d.  14.  Spironolactone 100 mg daily.  15.  Rifaximin 50 mg b.i.d.  16.  Senokot 2 tablets b.i.d.  17.  Zoloft 100 mg daily.  18.  Spiriva 1 capsule inhalation  daily.  19.  Coumadin 7 mg every 5:00 p.m.  20.  Flomax 0.4 mg p.o. daily.   DIAGNOSTIC DATA: PT and INR are 22.7 and 2.1. Creatinine 1.05, sodium 139, potassium 3.5, chloride 98, bicarb 35. White count 6. Glucose 92. H and H 9.8 and 30.5. UA negative for UTI. Troponin x3 negative. Lactic acid 1.2. Blood culture negative in 18 to 24 hours. Serum ethanol less than 3. Ammonia level 16. Urine drug screen was negative.   BRIEF SUMMARY OF HOSPITAL COURSE: John Conrad is a 69 year old Caucasian gentleman with history of hypertension, COPD, history of smoking in the past came in from [facility] with altered mental status. He was admitted with:  1.  Altered mental status with elevated CO2 and suspected due to hypercarbic respiratory failure with sleep apnea. The patient was put on BiPAP. His oxygen saturations remained stable on exertion and on room air. He is continued on CPAP at nighttime, however, recommended also to add CPAP during the daytime to avoid excessive CO2 narcosis. Sedating meds were held other than his Neurontin was started back because of his neuropathy. CT head was negative. Ammonia was normal. The patient is back to baseline and ambulated with nurses.  2.  Chronic A-fib, on Coumadin. He resumed back his digoxin and Coreg. His Cardizem was discontinued. INR is  2.1 at discharge.  3.  History of cirrhosis. Continued lactulose, spironolactone, Lasix and Rifaximin. 4.  Hypertension. Blood pressure is stable overall.  5.  History of osteomyelitis. Recently had a long course of IV antibiotics along with p.o. antibiotics per Paulino RilyAshley RN at [facility]. He is no longer on any antibiotic. For some reason the patient was started on Zosyn, which was discontinued. His white count is stable. Wound consult was obtained. Instructions for bacitracin ointment application will be done at the facility, per wound care nursing instructions.  6.  History of polysubstance abuse. Urine drug screen and  serum ethanol were negative.  7.  DVT prophylaxis. The patient remained on Coumadin with therapeutic INR.   DISPOSITION: The patient will be discharged to [facility]. He remained a FULL code.   TIME SPENT: 40 minutes.  ____________________________ Wylie HailSona A. Allena KatzPatel, MD sap:sb D: 12/20/2013 11:11:10 ET T: 12/20/2013 12:00:44 ET JOB#: [id number]  cc: Leita Lindbloom A. Allena KatzPatel, MD, <Dictator> Willow OraSONA A Aubriauna Riner MD ELECTRONICALLY SIGNED 12/23/2013 14:09

## 2014-07-22 DEATH — deceased

## 2016-02-25 IMAGING — CT CT HEAD WITHOUT CONTRAST
3 of 4 series · 15 of 47 positions shown, 18 images · non-contrast
Comparison: 05/24/2013

CLINICAL DATA: Fall. Head trauma. Anti coagulation. The patient
tripped and fell onto her driveway. Left periorbital swelling and
hematoma.

EXAM:
CT HEAD WITHOUT CONTRAST
CT MAXILLOFACIAL WITHOUT CONTRAST
TECHNIQUE: Multidetector CT imaging of the head and maxillofacial structures
were performed using the standard protocol without intravenous
contrast. Multiplanar CT image reconstructions of the maxillofacial
structures were also generated.

[Series 8: max soft-- · axial · 0.39mm/px · z∈[-272,-94]mm · 9 of 109 slices shown, 12 images]
[im 10/109  brain]
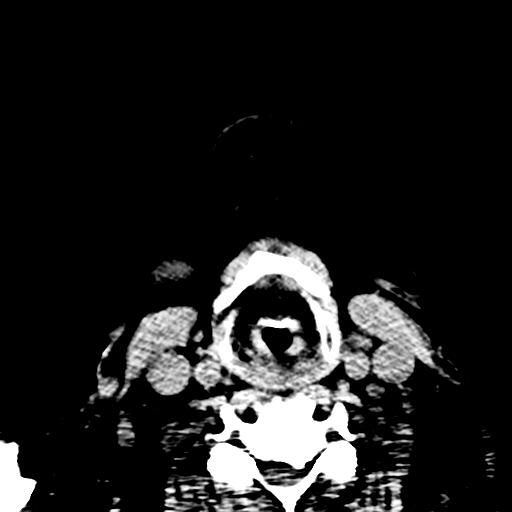
[im 10/109  bone]
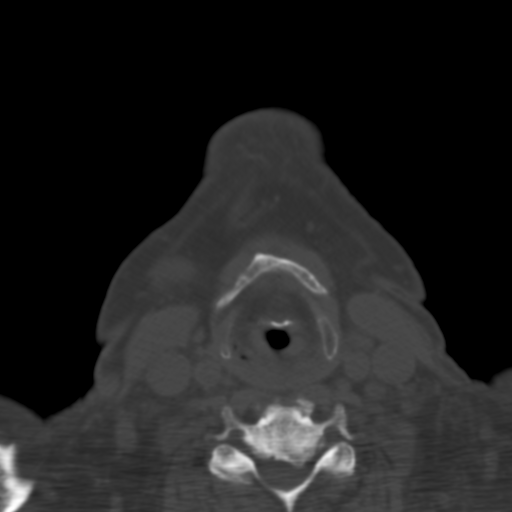
[im 20/109  brain]
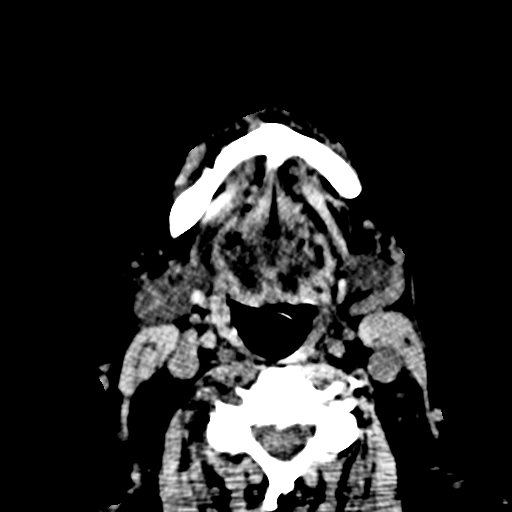
[im 30/109  brain]
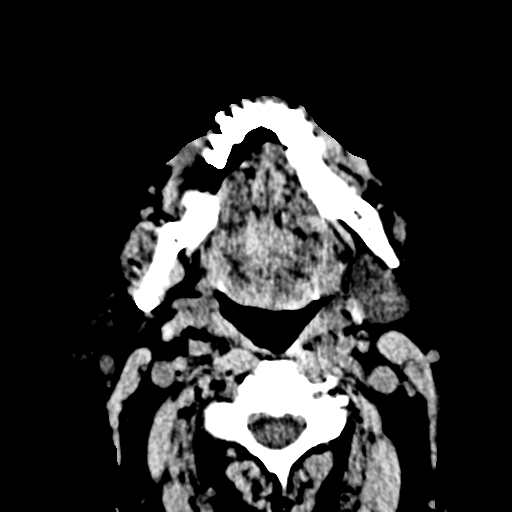
[im 45/109  brain]
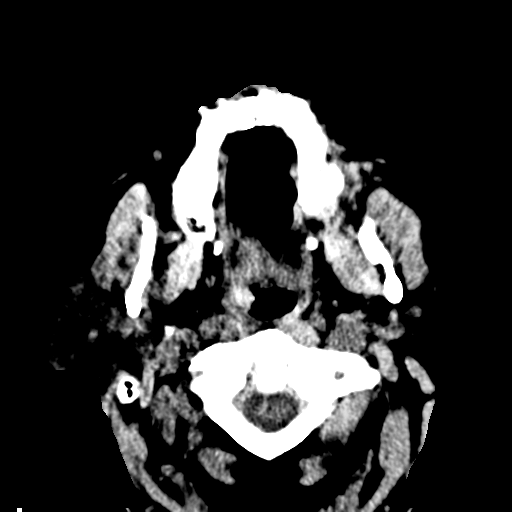
[im 55/109  brain]
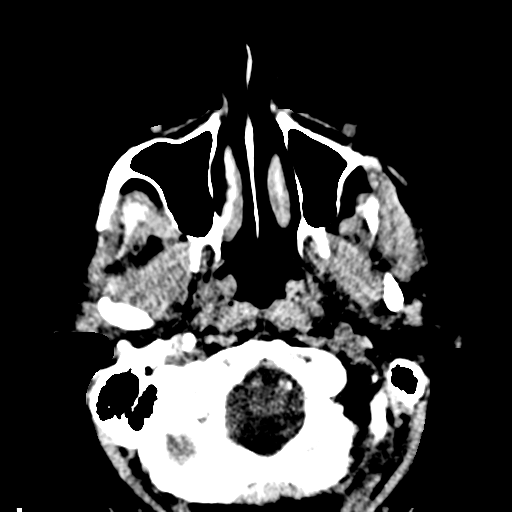
[im 55/109  bone]
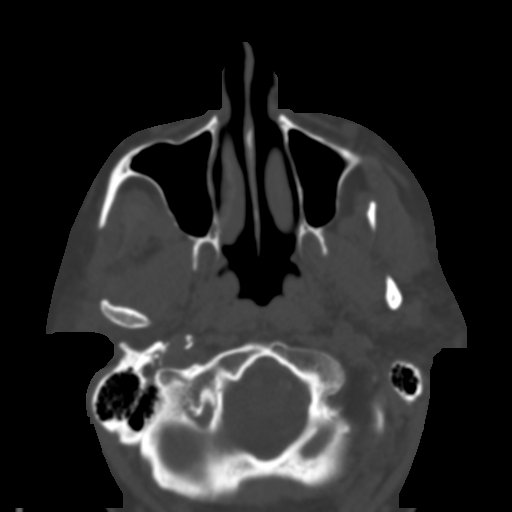
[im 64/109  brain]
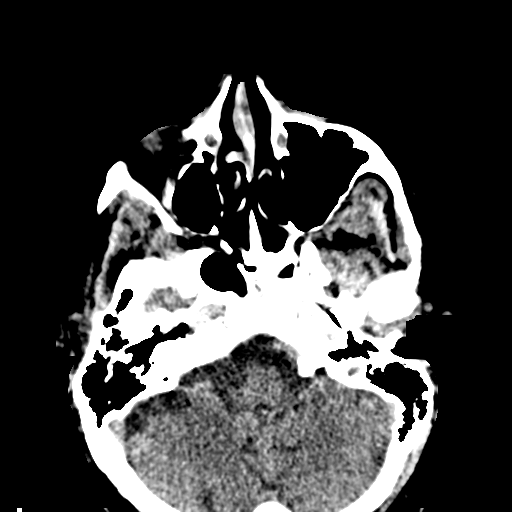
[im 79/109  brain]
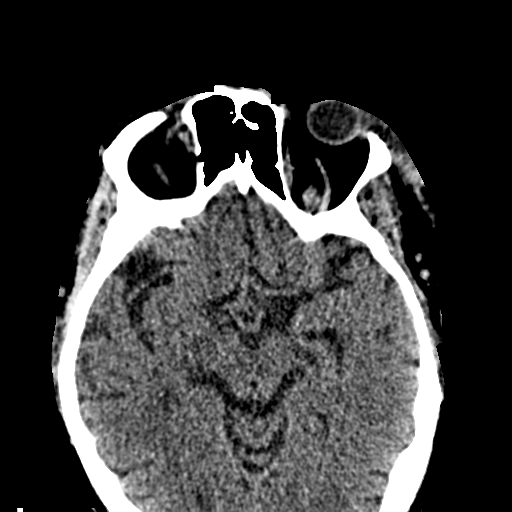
[im 89/109  brain]
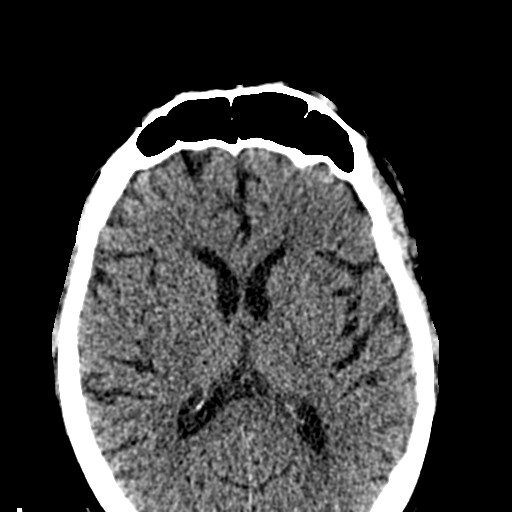
[im 99/109  brain]
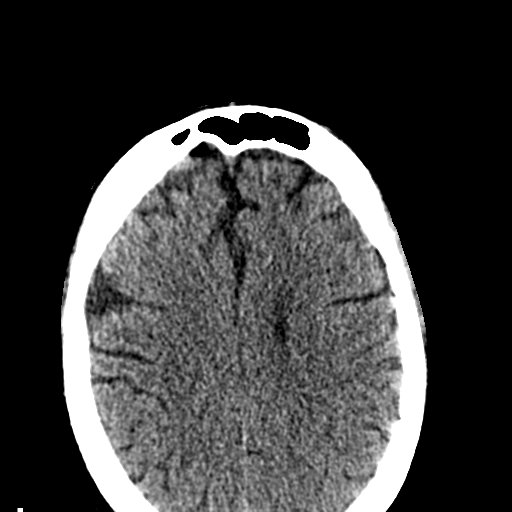
[im 99/109  bone]
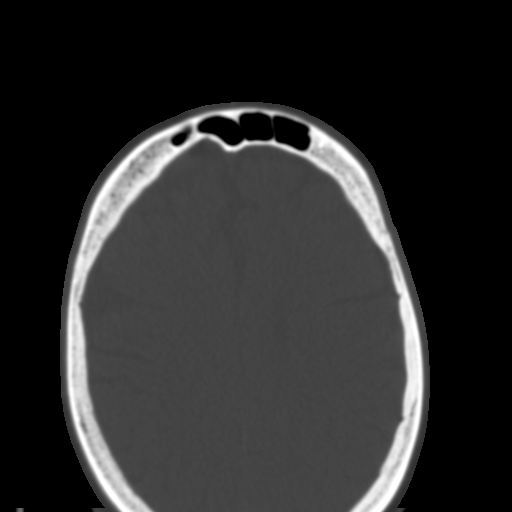

[Series 9: coronal soft · coronal · 0.40mm/px · 3 of 90 slices shown]
[im 30/90  brain]
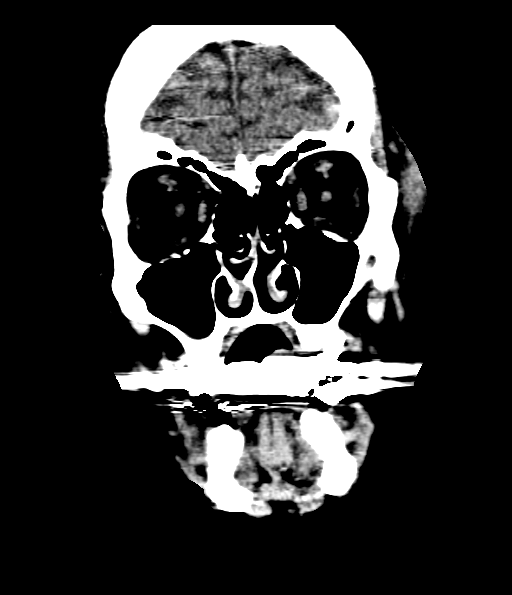
[im 40/90  brain]
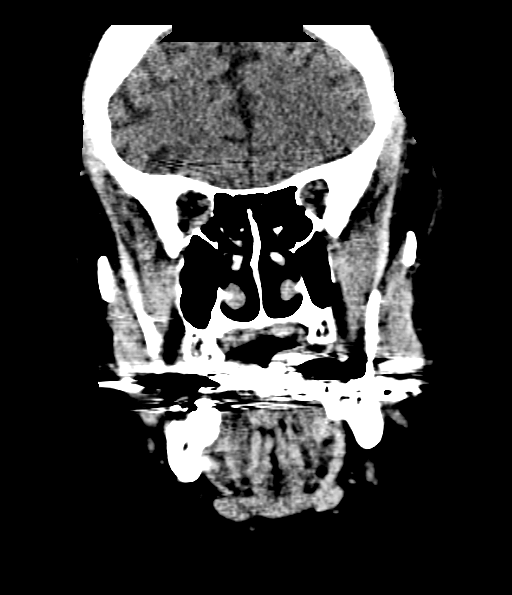
[im 50/90  brain]
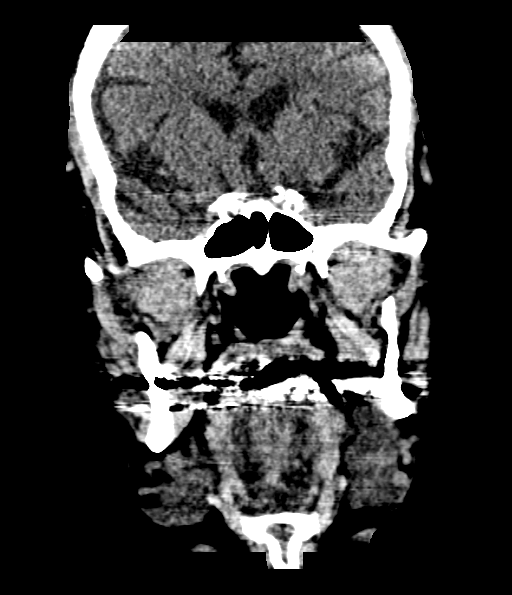

[Series 10: sagittal soft · sagittal · 0.45mm/px · 3 of 86 slices shown]
[im 29/86  brain]
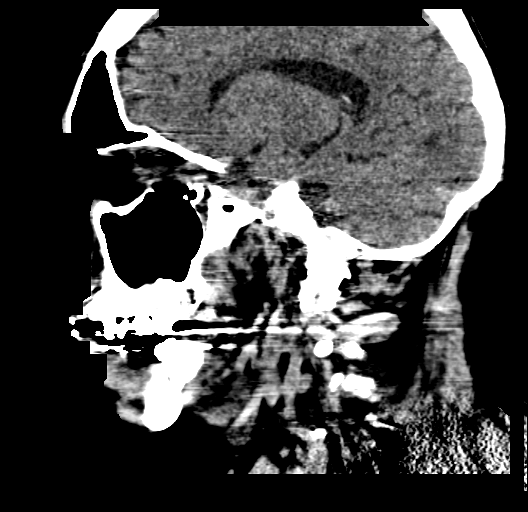
[im 43/86  brain]
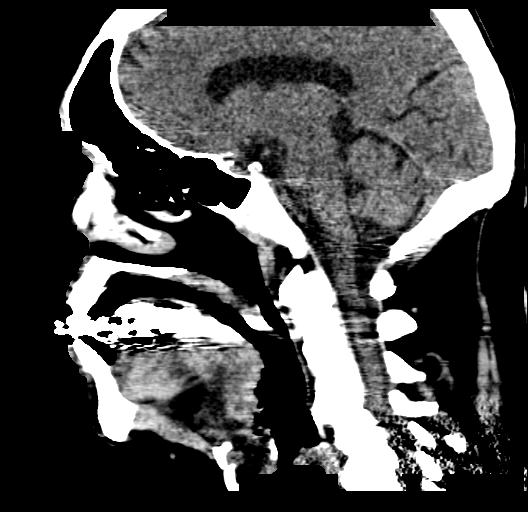
[im 57/86  brain]
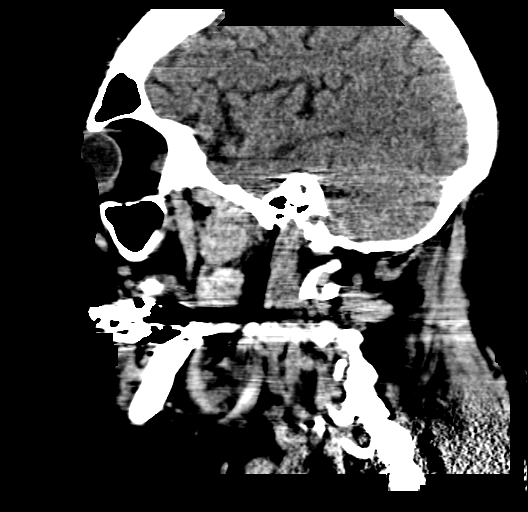

[15 of 47 positions shown; findings below may reference images not displayed]

FINDINGS: CT HEAD FINDINGS

The brainstem, cerebellum, cerebral peduncles, thalamus, basal
ganglia, basilar cisterns, and ventricular system appear within
normal limits. No intracranial hemorrhage, mass lesion, or acute
CVA. Left periorbital subcutaneous hematoma is observed.

Remote right medial orbital wall fracture, no change.

There is atherosclerotic calcification of the cavernous carotid
arteries bilaterally.

CT MAXILLOFACIAL FINDINGS

Remote right medial orbital wall fracture, image 35 series 14. No
extraocular muscle herniation.

Left periorbital subcutaneous hematoma particularly lateral to and
below the orbits. No acute underlying facial fracture. No
significant intraconal abnormality or specific abnormal or asymmetry
of the globes.

There is posterior osseous ridging at C3-4, C4-5, and C5-6, causing
osseous foraminal stenosis at each of these levels on the right, and
also on the left at C5-6.

Minimal chronic right maxillary sinusitis.
IMPRESSION: 1. Left periorbital subcutaneous hematoma, especially lateral to the
orbit, without acute facial fracture or acute intracranial findings.
2. Old right medial orbital wall fracture, no change from
05/24/2013.
3. Mild chronic right maxillary sinusitis.
4. Cervical spondylosis causing multilevel foraminal impingement as
detailed above.
